# Patient Record
Sex: Female | Born: 1985
Health system: Southern US, Community
[De-identification: ages and names within clinical notes are randomized; demographics above are authoritative.]

## PROBLEM LIST (undated history)

## (undated) ENCOUNTER — Inpatient Hospital Stay (HOSPITAL_COMMUNITY): Payer: Self-pay

## (undated) DIAGNOSIS — F419 Anxiety disorder, unspecified: Secondary | ICD-10-CM

## (undated) HISTORY — PX: DILATION AND CURETTAGE OF UTERUS: SHX78

## (undated) HISTORY — PX: WISDOM TOOTH EXTRACTION: SHX21

---

## 2007-12-08 ENCOUNTER — Emergency Department (HOSPITAL_COMMUNITY): Admission: EM | Admit: 2007-12-08 | Discharge: 2007-12-08 | Payer: Self-pay | Admitting: Emergency Medicine

## 2007-12-17 ENCOUNTER — Inpatient Hospital Stay (HOSPITAL_COMMUNITY): Admission: AD | Admit: 2007-12-17 | Discharge: 2007-12-17 | Payer: Self-pay | Admitting: Obstetrics and Gynecology

## 2007-12-20 ENCOUNTER — Observation Stay (HOSPITAL_COMMUNITY): Admission: AD | Admit: 2007-12-20 | Discharge: 2007-12-21 | Payer: Self-pay | Admitting: Obstetrics and Gynecology

## 2008-01-02 ENCOUNTER — Inpatient Hospital Stay (HOSPITAL_COMMUNITY): Admission: AD | Admit: 2008-01-02 | Discharge: 2008-01-02 | Payer: Self-pay | Admitting: Obstetrics & Gynecology

## 2008-03-01 ENCOUNTER — Ambulatory Visit (HOSPITAL_COMMUNITY): Admission: RE | Admit: 2008-03-01 | Discharge: 2008-03-01 | Payer: Self-pay | Admitting: Obstetrics

## 2008-07-30 ENCOUNTER — Inpatient Hospital Stay (HOSPITAL_COMMUNITY): Admission: AD | Admit: 2008-07-30 | Discharge: 2008-08-02 | Payer: Self-pay | Admitting: Obstetrics & Gynecology

## 2008-07-30 ENCOUNTER — Inpatient Hospital Stay (HOSPITAL_COMMUNITY): Admission: AD | Admit: 2008-07-30 | Discharge: 2008-07-30 | Payer: Self-pay | Admitting: Obstetrics

## 2008-07-31 ENCOUNTER — Encounter: Payer: Self-pay | Admitting: Obstetrics & Gynecology

## 2008-12-20 ENCOUNTER — Emergency Department (HOSPITAL_COMMUNITY): Admission: EM | Admit: 2008-12-20 | Discharge: 2008-12-20 | Payer: Self-pay | Admitting: Emergency Medicine

## 2009-06-02 ENCOUNTER — Emergency Department (HOSPITAL_COMMUNITY): Admission: EM | Admit: 2009-06-02 | Discharge: 2009-06-03 | Payer: Self-pay | Admitting: Emergency Medicine

## 2009-06-04 ENCOUNTER — Emergency Department (HOSPITAL_COMMUNITY): Admission: EM | Admit: 2009-06-04 | Discharge: 2009-06-04 | Payer: Self-pay | Admitting: Emergency Medicine

## 2010-11-13 LAB — COMPREHENSIVE METABOLIC PANEL
ALT: 13 U/L (ref 0–35)
AST: 16 U/L (ref 0–37)
Alkaline Phosphatase: 72 U/L (ref 39–117)
CO2: 26 mEq/L (ref 19–32)
Chloride: 108 mEq/L (ref 96–112)
GFR calc Af Amer: >60 mL/min (ref >60)
GFR calc non Af Amer: >60 mL/min (ref >60)
Sodium: 140 mEq/L (ref 135–145)
Total Bilirubin: 1.1 mg/dL (ref 0.3–1.2)

## 2010-11-13 LAB — DIFFERENTIAL
Basophils Absolute: 0 10*3/uL (ref 0.0–0.1)
Basophils Relative: 0 % (ref 0–1)
Eosinophils Absolute: 0 10*3/uL (ref 0.0–0.7)
Eosinophils Relative: 0 % (ref 0–5)

## 2010-11-13 LAB — URINALYSIS, ROUTINE W REFLEX MICROSCOPIC
Glucose, UA: NEGATIVE mg/dL
Ketones, ur: NEGATIVE mg/dL
Protein, ur: NEGATIVE mg/dL
Urobilinogen, UA: 1 mg/dL (ref 0.0–1.0)

## 2010-11-13 LAB — CBC
Hemoglobin: 13.9 g/dL (ref 12.0–15.0)
RBC: 4.68 MIL/uL (ref 3.87–5.11)
WBC: 8.4 10*3/uL (ref 4.0–10.5)

## 2010-11-13 LAB — LIPASE, BLOOD: Lipase: 16 U/L (ref 11–59)

## 2010-11-13 LAB — URINE MICROSCOPIC-ADD ON

## 2010-12-18 NOTE — Discharge Summary (Signed)
NAME:  Teresa Riddle, Teresa Riddle             ACCOUNT NO.:  [id number]   MEDICAL RECORD NO.:  [id number]          PATIENT TYPE:  OBV   LOCATION:  9307                          [facility]  [facility]   PHYSICIAN:  Allie Bossier, MD        DATE OF BIRTH:  1986/01/24   DATE OF ADMISSION:  12/20/2007  DATE OF DISCHARGE:  12/21/2007                               DISCHARGE SUMMARY   ADMISSION DIAGNOSES:  1. Intrauterine pregnancy at 8 weeks and 3 days' gestation.  2. Hyperemesis.   DISCHARGE DIAGNOSES:  1. Intrauterine pregnancy at 8 weeks and 5 days' gestation.  2. Hyperemesis.   PROCEDURE:  The patient did have an ultrasound performed on the day of  admission showing a single intrauterine gestation at 8 weeks and 3 days'  gestation.   COMPLICATIONS:  None.   CONSULTATIONS:  None.   PERTINENT LABORATORY FINDINGS:  The patient had admission urinalysis  that showed a specific gravity of greater than 1.030, 100 of glucose,  greater than 80 ketones, and 30 protein.  Basic metabolic panel was  grossly normal except for potassium of 3.2.  LFTs were normal.  Amylase  and lipase were normal.   BRIEF PERTINENT ADMISSION HISTORY:  [name] is a 25 year old gravida  3, para 1-0-1-1 at 8 weeks and 3 days' gestation admitted for  intractable nausea and vomiting.  She has had 3 previous MAU visits, at  which time, she was discharged home on Zofran.  Due to this intractable  nausea and vomiting and her current MAU visits, we will admit for IV  hydration and medical management of her hyperemesis.   HOSPITAL COURSE:  [name] was admitted, started on IV fluids, first  with a liter of LR and then 2 liters of D5-LR to 200 mL per hour.  She  was given Phenergan per rectum and Reglan p.o. as well as Zofran and  Protonix in her IV.  Her diet was n.p.o. initially and then advanced to  full liquids for breakfast.  She tolerated this well.  Her diet was  advanced to full diet for lunch, which she tolerated well.   Physical  examination was normal.  Vitals were stable.  She was stable for  discharge on hospital day #2.   DISCHARGE STATUS:  Stable.   DISCHARGE MEDICATIONS:  1. Continue Zofran as previously prescribed.  2. Reglan 10 mg 1 tablet up to 3 times daily with meals as needed for      nausea and vomiting.  3. Phenergan 25 mg per rectum 1 tablet every 6 hours as needed for      nausea and vomiting.   DISCHARGE INSTRUCTIONS:  1. Discharge home.  2. The patient was encouraged to eat a bland diet as tolerated.  3. Activity as tolerated.  4. The patient is to [facility] for her      prenatal care.  She is to call to schedule an appointment.      Karlton Lemon, MD  Electronically Signed     ______________________________  Allie Bossier, MD    NS/MEDQ  D:  12/21/2007  T:  12/22/2007  Job:  [id number]

## 2010-12-18 NOTE — H&P (Signed)
NAME:  LOXLEY, CIBRIAN NO.:  [id number]   MEDICAL RECORD NO.:  [id number]          PATIENT TYPE:  INP   LOCATION:  9169                          [facility]   PHYSICIAN:  Roseanna Rainbow, M.D.DATE OF BIRTH:  1985/08/25   DATE OF ADMISSION:  07/30/2008  DATE OF DISCHARGE:                              HISTORY & PHYSICAL   CHIEF COMPLAINT:  The patient is a 25 year old, para 1, with an  estimated date of confinement of December 24 with an intrauterine  pregnancy at 40 plus weeks, complaining of contractions.   HISTORY OF PRESENT ILLNESS:  Please see the above.  She denies rupture  of membranes.   ALLERGIES:  No known drug allergies.   MEDICATIONS:  Prenatal vitamins.   PRENATAL LABS:  Chlamydia probe negative, urine culture and sensitivity  insignificant growth, GC probe negative, 1-hour GTT 100, hepatitis B  surface antigen negative, hematocrit 38.9, herpes culture negative,  hemoglobin 12.1, HIV nonreactive, quad screen negative, blood type is O  positive, antibody screen negative, RPR nonreactive, rubella immune,  sickle cell negative, and GBS positive.   PAST OBSTETRICAL HISTORY:  There is a history of spontaneous abortion.  She was delivered of a live born infant in May 2007, full term, weight 7  pounds 14 ounces, and vaginal delivery.   PAST GYN HISTORY:  There is history of D&C.   PAST MEDICAL HISTORY:  Tension headaches.   PAST SURGICAL HISTORY:  Please see the above.   SOCIAL HISTORY:  Curator.  She is single, living with significant  other.  Does not give any significant history of alcohol use.  She has  no significant smoking history.  Denies illicit drug use.   FAMILY HISTORY:  Noncontributory.   PHYSICAL EXAMINATION:  VITAL SIGNS:  Stable, afebrile.  Fetal heart  tracing reassuring.  Tocodynamometer contractions every 2-4 minutes with  occasional coupling, sterile vaginal exam per the RN.  Cervix is 7-8 cm  dilated.  GENERAL:   Well developed and well nourished.  ABDOMEN:  Gravid.  By Thayer Ohm, estimated fetal weight of 3400 to 3700 g.   LABORATORY DATA:  GBS positive.   ASSESSMENT AND PLAN:  Primipara at term, active labor.  Fetal heart  tracing consistent with fetal well being.  GBS positive.   PLAN:  Admission, epidural, penicillin GBS prophylaxis.  Anticipate a  vaginal delivery.      Roseanna Rainbow, M.D.  Electronically Signed     LAJ/MEDQ  D:  07/30/2008  T:  07/31/2008  Job:  [id number]

## 2011-05-01 LAB — URINALYSIS, ROUTINE W REFLEX MICROSCOPIC
Ketones, ur: >80 — AB
Nitrite: NEGATIVE
Nitrite: NEGATIVE
Protein, ur: NEGATIVE
Urobilinogen, UA: 0.2
Urobilinogen, UA: 1
pH: 5.5

## 2011-05-01 LAB — BASIC METABOLIC PANEL
CO2: 25
Calcium: 9.8
GFR calc Af Amer: >60
Sodium: 135

## 2011-05-01 LAB — URINE MICROSCOPIC-ADD ON

## 2011-05-01 LAB — H. PYLORI ANTIBODY, IGG: H Pylori IgG: <0.4

## 2011-05-01 LAB — HEPATIC FUNCTION PANEL
Albumin: 3.2 — ABNORMAL LOW
Alkaline Phosphatase: 46
Total Protein: 5.6 — ABNORMAL LOW

## 2011-05-01 LAB — LIPASE, BLOOD: Lipase: 24

## 2011-05-10 LAB — CBC
HCT: 40.3 % (ref 36.0–46.0)
Hemoglobin: 11.4 g/dL — ABNORMAL LOW (ref 12.0–15.0)
Hemoglobin: 13.7 g/dL (ref 12.0–15.0)
MCHC: 34 g/dL (ref 30.0–36.0)
MCV: 87 fL (ref 78.0–100.0)
Platelets: 163 10*3/uL (ref 150–400)
RBC: 3.85 MIL/uL — ABNORMAL LOW (ref 3.87–5.11)
RBC: 4.63 MIL/uL (ref 3.87–5.11)
RDW: 14.1 % (ref 11.5–15.5)
WBC: 11.2 10*3/uL — ABNORMAL HIGH (ref 4.0–10.5)

## 2011-05-10 LAB — RPR: RPR Ser Ql: NONREACTIVE

## 2011-07-21 ENCOUNTER — Emergency Department (HOSPITAL_COMMUNITY)
Admission: EM | Admit: 2011-07-21 | Discharge: 2011-07-21 | Disposition: A | Discharge status: Home / Self Care | Payer: Self-pay | Source: Home / Self Care | Attending: Family Medicine | Admitting: Family Medicine

## 2011-07-21 DIAGNOSIS — J069 Acute upper respiratory infection, unspecified: Secondary | ICD-10-CM

## 2011-07-21 MED ORDER — GUAIFENESIN-CODEINE 100-10 MG/5ML PO SYRP: 10.0000 mL | ORAL_SOLUTION | Freq: Four times a day (QID) | ORAL | Status: AC | PRN

## 2011-07-21 NOTE — ED Provider Notes (Signed)
History     CSN: [id number] Arrival date & time: 07/21/2011  9:18 AM   First MD Initiated Contact with Patient 07/21/11 [phone]      Chief Complaint  Patient presents with  . Cough    (Consider location/radiation/quality/duration/timing/severity/associated sxs/prior treatment) Patient is a 25 y.o. female presenting with cough. The history is provided by the patient.  Cough This is a new problem. The current episode started more than 2 days ago. The problem has not changed since onset.The cough is productive of sputum. Associated symptoms include rhinorrhea and sore throat. She has tried decongestants for the symptoms. She is not a smoker.    History reviewed. No pertinent past medical history.  Past Surgical History  Procedure Date  . Dilation and curettage of uterus     History reviewed. No pertinent family history.  History  Substance Use Topics  . Smoking status: Never Smoker   . Smokeless tobacco: Not on file  . Alcohol Use: No    OB History    Grav Para Term Preterm Abortions TAB SAB Ect Mult Living                  Review of Systems  Constitutional: Negative.   HENT: Positive for congestion, sore throat, rhinorrhea and postnasal drip.   Respiratory: Positive for cough.   Gastrointestinal: Negative.   Skin: Negative.     Allergies  Review of patient's allergies indicates no known allergies.  Home Medications   Current Outpatient Rx  Name Route Sig Dispense Refill  . OVER THE COUNTER MEDICATION       . GUAIFENESIN-CODEINE 100-10 MG/5ML PO SYRP Oral Take 10 mLs by mouth 4 (four) times daily as needed for cough. 180 mL 0    BP 114/66  Pulse 76  Temp(Src) 98.4 F (36.9 C) (Oral)  Resp 18  LMP 07/15/2011  Physical Exam  Nursing note and vitals reviewed. Constitutional: She appears well-developed and well-nourished.  HENT:  Head: Normocephalic.  Right Ear: External ear normal.  Left Ear: External ear normal.  Mouth/Throat: Oropharynx is clear and  moist.  Eyes: Pupils are equal, round, and reactive to light.  Neck: Normal range of motion. Neck supple.  Cardiovascular: Normal rate and normal heart sounds.   Pulmonary/Chest: Effort normal and breath sounds normal.  Skin: Skin is warm and dry.    ED Course  Procedures (including critical care time)  Labs Reviewed - No data to display No results found.   1. URI (upper respiratory infection)       MDM          Barkley Bruns, MD 07/21/11 [id number]

## 2011-07-21 NOTE — ED Notes (Signed)
Pt has cough, sorethroat and earache for three days.

## 2014-04-01 ENCOUNTER — Encounter (HOSPITAL_COMMUNITY): Payer: Self-pay | Admitting: Emergency Medicine

## 2014-04-01 ENCOUNTER — Emergency Department (HOSPITAL_COMMUNITY)
Admission: EM | Admit: 2014-04-01 | Discharge: 2014-04-01 | Disposition: A | Discharge status: Home / Self Care | Payer: Self-pay | Attending: Emergency Medicine | Admitting: Emergency Medicine

## 2014-04-01 DIAGNOSIS — K089 Disorder of teeth and supporting structures, unspecified: Secondary | ICD-10-CM | POA: Insufficient documentation

## 2014-04-01 DIAGNOSIS — Z792 Long term (current) use of antibiotics: Secondary | ICD-10-CM | POA: Insufficient documentation

## 2014-04-01 DIAGNOSIS — K0889 Other specified disorders of teeth and supporting structures: Secondary | ICD-10-CM

## 2014-04-01 MED ORDER — PENICILLIN V POTASSIUM 500 MG PO TABS: 500.0000 mg | ORAL_TABLET | Freq: Four times a day (QID) | ORAL | Status: AC

## 2014-04-01 MED ORDER — BUPIVACAINE-EPINEPHRINE (PF) 0.5% -1:200000 IJ SOLN
1.8000 mL | Freq: Once | INTRAMUSCULAR | Status: AC
  Administered 2014-04-01: 1.8 mL
  Filled 2014-04-01: qty 1.8

## 2014-04-01 MED ORDER — HYDROCODONE-ACETAMINOPHEN 5-325 MG PO TABS: 1.0000 | ORAL_TABLET | Freq: Four times a day (QID) | ORAL | Status: DC | PRN

## 2014-04-01 NOTE — Discharge Instructions (Signed)
Dental Pain °A tooth ache may be caused by cavities (tooth decay). Cavities expose the nerve of the tooth to air and hot or cold temperatures. It may come from an infection or abscess (also called a boil or furuncle) around your tooth. It is also often caused by dental caries (tooth decay). This causes the pain you are having. °DIAGNOSIS  °Your caregiver can diagnose this problem by exam. °TREATMENT  °· If caused by an infection, it may be treated with medications which kill germs (antibiotics) and pain medications as prescribed by your caregiver. Take medications as directed. °· Only take over-the-counter or prescription medicines for pain, discomfort, or fever as directed by your caregiver. °· Whether the tooth ache today is caused by infection or dental disease, you should see your dentist as soon as possible for further care. °SEEK MEDICAL CARE IF: °The exam and treatment you received today has been provided on an emergency basis only. This is not a substitute for complete medical or dental care. If your problem worsens or new problems (symptoms) appear, and you are unable to meet with your dentist, call or return to this location. °SEEK IMMEDIATE MEDICAL CARE IF:  °· You have a fever. °· You develop redness and swelling of your face, jaw, or neck. °· You are unable to open your mouth. °· You have severe pain uncontrolled by pain medicine. °MAKE SURE YOU:  °· Understand these instructions. °· Will watch your condition. °· Will get help right away if you are not doing well or get worse. °Document Released: 07/22/2005 Document Revised: 10/14/2011 Document Reviewed: 03/09/2008 °ExitCare® Patient Information ©2015 [employer]. This information is not intended to replace advice given to you by your health care provider. Make sure you discuss any questions you have with your health care provider. ° °Emergency Department Resource Guide °1) Find a Doctor and Pay Out of Pocket °Although you won't have to find out who  is covered by your insurance plan, it is a good idea to ask around and get recommendations. You will then need to call the office and see if the doctor you have chosen will accept you as a new patient and what types of options they offer for patients who are self-pay. Some doctors offer discounts or will set up payment plans for their patients who do not have insurance, but you will need to ask so you aren't surprised when you get to your appointment. ° °2) Contact Your Local Health Department °Not all health departments have doctors that can see patients for sick visits, but many do, so it is worth a call to see if yours does. If you don't know where your local health department is, you can check in your phone book. The CDC also has a tool to help you locate your state's health department, and many state websites also have listings of all of their local health departments. ° °3) Find a Walk-in Clinic °If your illness is not likely to be very severe or complicated, you may want to try a walk in clinic. These are popping up all over the country in pharmacies, drugstores, and shopping centers. They're usually staffed by nurse practitioners or physician assistants that have been trained to treat common illnesses and complaints. They're usually fairly quick and inexpensive. However, if you have serious medical issues or chronic medical problems, these are probably not your best option. ° °No Primary Care Doctor: °- Call [facility] at  832-8000 - they can help you locate a primary   care doctor that  accepts your insurance, provides certain services, etc. °- Physician Referral Service- 1-800-533-3463 ° °Chronic Pain Problems: °Organization         Address  Phone   Notes  °[facility]  (336) 297-2271 Patients need to be referred by their primary care doctor.  ° °Medication Assistance: °Organization         Address  Phone   Notes  °[place] [facility] [address]  [address] °[place], [place] [address] (336) 641-8030 --Must be a resident of [place] °-- Must have NO insurance coverage whatsoever (no Medicaid/ Medicare, etc.) °-- The pt. MUST have a primary care doctor that directs their care regularly and follows them in the community °  °[facility]  (866) 331-1348   °[employer]  (888) 892-1162   ° °Agencies that provide inexpensive medical care: °Organization         Address  Phone   Notes  °[facility]  (336) 832-8035   °[facility]    (336) 832-7272   °[facility] [address] °[place], [place] [address] (336) 832-4777   °[facility] [address], °[place] (336) 271-4999   °[facility]    (336) 373-0678   °[facility]    (336) 272-1050   °[facility] ° [address], [place] Phone:  (336) 832-4444, Fax:  (336) 832-4440 Hours of Operation:  9 am - 6 pm, M-F.  Also accepts Medicaid/Medicare and self-pay.  °[facility] ° [address], [place] Phone: (336) 832-3150, Fax: (336) 832-3151. Hours of Operation:  8:30 am - 5:30 pm, M-F.  Also accepts Medicaid and self-pay.  °[facility] [place] [address], [place] Phone: (336) 878-6027   °[facility] [address], [place], [place] (336)723-1848, Ext. 123 Mondays & Thursdays: 7-9 AM.  First 15 patients are seen on a first come, first serve basis. °  ° °Medicaid-accepting [place] Providers: ° °Organization         Address  Phone   Notes  °[facility] [address], [place] (336) 641-2100 Also accepts self-pay patients.  °[facility] [address], [place] ° (336) 856-9996   °[facility] [address], [place] (336) 288-8857   °[facility] [address] [place] [address], [place] (336) 299-7000   °Veita Bland [address], [place]  ° (336) 373-1557 Only accepts [place] Access Medicaid patients after they have their name applied to their card.  ° °Self-Pay (no insurance) in [place]: ° °Organization         Address  Phone   Notes  °Sickle Cell Patients, [facility] [address], [place] (336) 832-1970   °[facility] [address], [place] (336) 832-4400   °[facility] ° [address], [place] (336) 992-4800   °[facility]/Dr. Osei-Bonsu ° [address] [place] [address], [place] or [address], [place] (336) 841-8500 Phone number for both [place] and [place] locations is the same.  °[facility] [address], [place] (336) 299-0000   °[facility] [address] [place] [address], [place] or [address] (336) 852-7530 °(336) 878-2260   °  [facility] [address], [place] (336) 350-1642, phone; (336) 294-5005, fax Sees patients 1st and 3rd Saturday of every month.  Must not qualify for public or private insurance (i.e. Medicaid, Medicare, [place] Health Choice, Veterans' Benefits) • Household income should be no more than 200% of the poverty level •The clinic cannot treat you if you are pregnant or think you are pregnant • Sexually transmitted diseases are not treated at the clinic.  ° ° °Dental Care: °Organization         Address  Phone  Notes  °[facility] [address], [place] (336) 641-6152 Accepts children up to age 21 who are enrolled in Medicaid or [place] Health Choice; pregnant women with a Medicaid card; and children who have applied for Medicaid or [place] Health Choice, but were declined, whose parents can pay a reduced fee at time of service.  °[facility] [place]  [address], [place] (336) 641-7733 Accepts children up to age 21 who are  enrolled in Medicaid or [place] Health Choice; pregnant women with a Medicaid card; and children who have applied for Medicaid or [place] Health Choice, but were declined, whose parents can pay a reduced fee at time of service.  °[facility] ° [address], [place] (336) 641-4533 Patients are seen by appointment only. Walk-ins are not accepted. [facility] will see patients 18 years of age and older. °Monday - Tuesday (8am-5pm) °Most Wednesdays (8:30-5pm) °$30 per visit, cash only  °[facility] ° [address], [place] (336) 641-4533 Patients are seen by appointment only. Walk-ins are not accepted. [facility] will see patients 18 years of age and older. °One Wednesday Evening (Monthly: Volunteer Based).  $30 per visit, cash only  °[facility]  (919) 537-3737 for adults; Children under age 4, call Graduate Pediatric Dentistry at (919) 537-3956. Children aged 4-14, please call (919) 537-3737 to request a pediatric application. ° Dental services are provided in all areas of dental care including fillings, crowns and bridges, complete and partial dentures, implants, gum treatment, root canals, and extractions. Preventive care is also provided. Treatment is provided to both adults and children. °Patients are selected via a lottery and there is often a waiting list. °  °[facility] [address], °[place] ° (336) 763-8833 [email or web address] °  °[facility] [address], [place], [place] (336)723-1848, Ext. 123 Second and Fourth Thursday of each month, opens at 6:30 AM; Clinic ends at 9 AM.  Patients are seen on a first-come first-served basis, and a limited number are seen during each clinic.  ° °[facility] ° [address], [place], [place] (336) 723-7904   Eligibility Requirements °You must have lived in [place], [place], or [place] counties for at least the last three months. °  You  cannot be eligible for state or federal sponsored healthcare insurance, including Veterans Administration, Medicaid, or Medicare. °  You generally cannot be eligible for healthcare insurance through your employer.  °  How to apply: °Eligibility screenings are held every Tuesday and Wednesday afternoon from 1:00 pm until 4:00 pm. You do not need an appointment for the interview!  °[facility] [address], [place], [place] 336-631-2330   °[place] [facility]  336-342-8273   °[place] [facility]  336-703-3100   ° [facility]  336-570-6415   ° °Behavioral Health Resources in the Community: °Intensive Outpatient Programs °Organization         Address  Phone  Notes  °[facility] [address], [place], [place] 336-878-6098   °[facility] [address], [place], [place] 336-832-9800   °[facility] [address], [place], [place] ° 336-882-2125   °[facility] [address],  °[place], [place] 1-800-853-5163 or 336-641-4981   °Substance Abuse Resources °Organization         Address  Phone  Notes  °[facility]  336-882-2125   °[facility]  336-784-9470   °[facility]  336-285-9073   °[facility]  336-845-3988   °Residential & Outpatient Substance Abuse Program  1-800-659-3381   °Psychological Services °Organization         Address  Phone  Notes  °[facility]  336- 832-9600   °[facility]  336- 378-7881   °[facility] [address], [place] 1-800-853-5163 or 336-641-4981   ° °Mobile Crisis Teams °Organization         Address  Phone  Notes  °[facility], Mobile Crisis Care Unit  1-877-626-1772   °Assertive °[facility] ° [address] [place], [place] 336-834-9664   °Sharon DeEsch [address] °[place] 336-554-5454   ° °Self-Help/Support  Groups °Organization         Address  Phone             Notes  °[facility] [facility] - variety of support groups  336- 373-1402 Call for more information  °Narcotics Anonymous (NA), [facility] [address], °[place]  2 meetings at this location  ° °Residential Treatment Programs °Organization         Address  Phone  Notes  °[facility] [address],    °[place]  1-866-801-8205   °[facility] ° [address], [place], [place] 704-293-8524   °[facility] [address], [place] 336-845-3988 Admissions: 8am-3pm M-F  °[facility] [address]    °[place], [place] 336-841-1104   °[facility] [address], [place], [place] 336-379-7146   °[facility] [address]  °[place], [place] 336-285-9073   °[facility] - Intensive Outpatient [address], , [place] 336-852-3033   °[facility] [address]  °[place], [place] 1-877-615-2722 or 336-784-9470   °[facility] ([facility] [address] [place], [place] 336-227-7417 Accepts Medicaid  °[facility] [address]  ° [place] 1-800-659-3381 Substance Abuse/Addiction Treatment  ° °[place] Behavioral Health Resources °Organization         Address  Phone  Notes  °[facility]  (888) 581-9988   °Julie Brannon, PhD [address] [place], [place]   (336) 349-5553 or (336) 951-0000   °[facility]   [address] °[place], [place] (336) 349-4454   °[facility] [address], [place], [place] (336) 342-8316 Insurance/Medicaid/sponsorship through [facility]  °[facility] [address]                                    , [place] (336) 342-8316 Therapy/tele-psych/case  °[facility]   [address]  ° [place], [place] (336) 349-2233    °Dr. Arfeen  (336) 349-4544   °[facility] [place]  [facility]  [employer] [place] [facility] 1) [address], [place] °2) [address], [place] °3)  [address], [place] (336) 349-3220 °(336) 342-7768 ° °(336) 342-8140   °[place] [facility] (336) 342-1394 or (336) 342-3537 (After Hours)    ° ° ° °

## 2014-04-01 NOTE — ED Notes (Addendum)
Pt reports rt lower dental pain x2 days, pt w/ rt facial swelling - denies any fever. Pt states she started taking her sister's meloxicam and doxycycline w/o improvement of symptoms.

## 2014-04-01 NOTE — ED Notes (Signed)
Aware to take abx until completion. Knows not to drink alcohol/drive/operate heavy machinery while taking Norco. Given resource guide. No other questions/concerns.

## 2014-04-01 NOTE — ED Provider Notes (Signed)
CSN: [id number]     Arrival date & time 04/01/14  2140 History  This chart was scribed for non-physician practitioner, Teresa Breach, PA-C working with [place], DO by Teresa Riddle, ED scribe. This patient was seen in room WTR8/WTR8 and the patient's care was started at 10:31 PM.    Chief Complaint  Patient presents with  . Dental Pain   The history is provided by the patient. No language interpreter was used.   HPI Comments: Teresa Riddle is a 28 y.o. female who presents to the Emergency Department complaining of worsening dental pain that started approximately 2 days ago. He states that her symptoms started with a gradual onset of pain. She states that the tooth had a filling on it which broke off, but that was a long time ago. Ms.Teresa Riddle describes the pain as "throbbing" in nature and radiating up her right ear. Pt states that the pain is worsened by talking and chewing. She reports taking Ibuprofen 800 MG which worked but then the pain would return, her sisters doxycyline (2 pills), and meloxicam. Pt states that she does not have a dentist. Denies any drainage or medicinal allergies.  No past medical history on file. Past Surgical History  Procedure Laterality Date  . Dilation and curettage of uterus     No family history on file. History  Substance Use Topics  . Smoking status: Never Smoker   . Smokeless tobacco: Not on file  . Alcohol Use: No   OB History   Grav Para Term Preterm Abortions TAB SAB Ect Mult Living                 Review of Systems  HENT: Positive for dental problem.   All other systems reviewed and are negative.  Allergies  Review of patient's allergies indicates no known allergies.  Home Medications   Prior to Admission medications   Medication Sig Start Date End Date Taking? Authorizing Provider  ibuprofen (ADVIL,MOTRIN) 200 MG tablet Take 200 mg by mouth every 6 (six) hours as needed.   Yes Historical Provider, MD  HYDROcodone-acetaminophen  (NORCO/VICODIN) 5-325 MG per tablet Take 1-2 tablets by mouth every 6 (six) hours as needed for moderate pain or severe pain. 04/01/14   Teresa Breach, PA-C  OVER THE COUNTER MEDICATION      Historical Provider, MD  penicillin v potassium (VEETID) 500 MG tablet Take 1 tablet (500 mg total) by mouth 4 (four) times daily. 04/01/14 04/08/14  Teresa Breach, PA-C   Triage Vitals: BP 126/77  Pulse 68  Temp(Src) 98.8 F (37.1 C) (Oral)  Resp 20  Ht 5\' 4"  (1.626 m)  Wt 240 lb (108.863 kg)  BMI 41.18 kg/m2  SpO2 100%  LMP 03/21/2014  Physical Exam  Nursing note and vitals reviewed. Constitutional: She is oriented to person, place, and time. She appears well-developed and well-nourished. No distress.  Nontoxic/nonseptic appearing  HENT:  Head: Normocephalic and atraumatic.  Right Ear: Hearing, tympanic membrane, external ear and ear canal normal. No mastoid tenderness.  Left Ear: Hearing, tympanic membrane, external ear and ear canal normal. No mastoid tenderness.  Nose: Nose normal.  Mouth/Throat: Uvula is midline, oropharynx is clear and moist and mucous membranes are normal. No oral lesions. No trismus in the jaw. No dental abscesses or uvula swelling.    Tenderness to palpation of right lower first molar. No gingival swelling or fluctuance. Patient tolerating secretions without difficulty. Uvula midline.  Eyes: Conjunctivae and EOM are normal. No scleral  icterus.  Neck: Normal range of motion. Neck supple.  Pulmonary/Chest: Effort normal. No respiratory distress.  Musculoskeletal: Normal range of motion.  Neurological: She is alert and oriented to person, place, and time.  Skin: Skin is warm and dry. No rash noted. She is not diaphoretic. No erythema. No pallor.  Psychiatric: She has a normal mood and affect. Her behavior is normal.    ED Course  Dental Date/Time: 04/01/2014 10:41 PM Performed by: Teresa Riddle Authorized by: Teresa Riddle Consent: Verbal consent obtained. written consent  not obtained. The procedure was performed in an emergent situation. Risks and benefits: risks, benefits and alternatives were discussed Consent given by: patient Patient understanding: patient states understanding of the procedure being performed Patient consent: the patient's understanding of the procedure matches consent given Procedure consent: procedure consent matches procedure scheduled Relevant documents: relevant documents present and verified Test results: test results available and properly labeled Site marked: the operative site was marked Imaging studies: imaging studies available Required items: required blood products, implants, devices, and special equipment available Patient identity confirmed: verbally with patient and arm band Time out: Immediately prior to procedure a "time out" was called to verify the correct patient, procedure, equipment, support staff and site/side marked as required. Preparation: Patient was prepped and draped in the usual sterile fashion. Local anesthesia used: yes Anesthesia: local infiltration Local anesthetic: bupivacaine 0.5% with epinephrine Anesthetic total: 1.8 ml Patient sedated: no Patient tolerance: Patient tolerated the procedure well with no immediate complications.   (including critical care time)  DIAGNOSTIC STUDIES: Oxygen Saturation is 100% on RA, normal by my interpretation.    COORDINATION OF CARE: 10:37 PM- Pt advised of plan for treatment and pt agrees.  Labs Review Labs Reviewed - No data to display  Imaging Review No results found.   EKG Interpretation None      MDM   Final diagnoses:  Dentalgia    Patient with toothache. No gross abscess. Exam unconcerning for Ludwig's angina or spread of infection. Tx in ED with dental block. Will treat with penicillin and pain medicine. Urged patient to follow-up with dentist. Referral and resource guide provided. Return precautions also provided and patient agreeable to  plan with no unaddressed concerns.  I personally performed the services described in this documentation, which was scribed in my presence. The recorded information has been reviewed and is accurate.   Filed Vitals:   04/01/14 2158  BP: 126/77  Pulse: 68  Temp: 98.8 F (37.1 C)  TempSrc: Oral  Resp: 20  Height: 5\' 4"  (1.626 m)  Weight: 240 lb (108.863 kg)  SpO2: 100%      Teresa Breach, PA-C 04/01/14 2241

## 2014-04-01 NOTE — ED Provider Notes (Signed)
Medical screening examination/treatment/procedure(s) were performed by non-physician practitioner and as supervising physician I was immediately available for consultation/collaboration.   EKG Interpretation None        [place], DO 04/01/14 2342

## 2014-05-30 ENCOUNTER — Emergency Department (HOSPITAL_COMMUNITY)
Admission: EM | Admit: 2014-05-30 | Discharge: 2014-05-30 | Disposition: A | Discharge status: Home / Self Care | Payer: Self-pay | Attending: Emergency Medicine | Admitting: Emergency Medicine

## 2014-05-30 ENCOUNTER — Encounter (HOSPITAL_COMMUNITY): Payer: Self-pay | Admitting: Emergency Medicine

## 2014-05-30 ENCOUNTER — Emergency Department (HOSPITAL_COMMUNITY): Payer: Self-pay

## 2014-05-30 DIAGNOSIS — R1012 Left upper quadrant pain: Secondary | ICD-10-CM | POA: Insufficient documentation

## 2014-05-30 DIAGNOSIS — R079 Chest pain, unspecified: Secondary | ICD-10-CM

## 2014-05-30 DIAGNOSIS — R0789 Other chest pain: Secondary | ICD-10-CM | POA: Insufficient documentation

## 2014-05-30 LAB — I-STAT TROPONIN, ED: TROPONIN I, POC: 0 ng/mL (ref 0.00–0.08)

## 2014-05-30 LAB — BASIC METABOLIC PANEL
Anion gap: 10 (ref 5–15)
BUN: 10 mg/dL (ref 6–23)
CO2: 27 mEq/L (ref 19–32)
Calcium: 9.4 mg/dL (ref 8.4–10.5)
Chloride: 104 mEq/L (ref 96–112)
Creatinine, Ser: 0.94 mg/dL (ref 0.50–1.10)
GFR calc Af Amer: >90 mL/min (ref >90)
GFR, EST NON AFRICAN AMERICAN: 82 mL/min — AB (ref >90)
Glucose, Bld: 102 mg/dL — ABNORMAL HIGH (ref 70–99)
POTASSIUM: 4.1 meq/L (ref 3.7–5.3)
SODIUM: 141 meq/L (ref 137–147)

## 2014-05-30 LAB — HEPATIC FUNCTION PANEL
ALT: 10 U/L (ref 0–35)
AST: 15 U/L (ref 0–37)
Albumin: 4 g/dL (ref 3.5–5.2)
Alkaline Phosphatase: 79 U/L (ref 39–117)
Total Bilirubin: 0.2 mg/dL — ABNORMAL LOW (ref 0.3–1.2)
Total Protein: 7.4 g/dL (ref 6.0–8.3)

## 2014-05-30 LAB — CBC
HCT: 38 % (ref 36.0–46.0)
Hemoglobin: 12.4 g/dL (ref 12.0–15.0)
MCH: 28.7 pg (ref 26.0–34.0)
MCHC: 32.6 g/dL (ref 30.0–36.0)
MCV: 88 fL (ref 78.0–100.0)
PLATELETS: 396 10*3/uL (ref 150–400)
RBC: 4.32 MIL/uL (ref 3.87–5.11)
RDW: 13 % (ref 11.5–15.5)
WBC: 5 10*3/uL (ref 4.0–10.5)

## 2014-05-30 LAB — LIPASE, BLOOD: Lipase: 25 U/L (ref 11–59)

## 2014-05-30 LAB — D-DIMER, QUANTITATIVE: D-Dimer, Quant: 0.31 ug/mL-FEU (ref 0.00–0.48)

## 2014-05-30 MED ORDER — KETOROLAC TROMETHAMINE 60 MG/2ML IM SOLN
60.0000 mg | Freq: Once | INTRAMUSCULAR | Status: AC
  Administered 2014-05-30: 60 mg via INTRAMUSCULAR
  Filled 2014-05-30: qty 2

## 2014-05-30 MED ORDER — NAPROXEN 500 MG PO TABS: 500.0000 mg | ORAL_TABLET | Freq: Two times a day (BID) | ORAL | Status: DC

## 2014-05-30 MED ORDER — OXYCODONE-ACETAMINOPHEN 5-325 MG PO TABS
1.0000 | ORAL_TABLET | Freq: Once | ORAL | Status: AC
  Administered 2014-05-30: 1 via ORAL
  Filled 2014-05-30: qty 1

## 2014-05-30 MED ORDER — HYDROCODONE-ACETAMINOPHEN 5-325 MG PO TABS: 1.0000 | ORAL_TABLET | Freq: Four times a day (QID) | ORAL | Status: DC | PRN

## 2014-05-30 NOTE — ED Notes (Signed)
Denies nausea  

## 2014-05-30 NOTE — ED Provider Notes (Signed)
CSN: [id number]     Arrival date & time 05/30/14  1557 History   First MD Initiated Contact with Patient 05/30/14 1739     Chief Complaint  Patient presents with  . Chest Pain     (Consider location/radiation/quality/duration/timing/severity/associated sxs/prior Treatment) HPI Teresa Riddle is a 28 y.o. female but no medical problems, presents to emergency department complaining of chest pain. Patient states she noticed pain this morning. She woke up pain-free and states she developed it after breakfast. Reports pain is in the center radiating to the left chest under the breast and around. Pain is worsened with movement, moving left arm, taking deep breaths. She states pain is sharp. She reports associated shortness of breath, but denies shortness of breath when there is no pain. Pain does come and go. She denies any fever, chills. No cough. No upper respiratory symptoms. No swelling in extremities. No recent travel or surgeries. She has an IUD as her birth control method. No history of blood clots. Denies any injuries. Does not remember pushing, pulling, lifting anything heavy. No history of similar pain in the past. She took Gas-X, thinking she may have gas but it did not help her.  History reviewed. No pertinent past medical history. Past Surgical History  Procedure Laterality Date  . Dilation and curettage of uterus     No family history on file. History  Substance Use Topics  . Smoking status: Never Smoker   . Smokeless tobacco: Not on file  . Alcohol Use: No   OB History   Grav Para Term Preterm Abortions TAB SAB Ect Mult Living                 Review of Systems  Constitutional: Negative for fever and chills.  Respiratory: Negative for cough, chest tightness and shortness of breath.   Cardiovascular: Positive for chest pain. Negative for palpitations and leg swelling.  Gastrointestinal: Negative for nausea, vomiting, abdominal pain and diarrhea.  Genitourinary: Negative  for dysuria and flank pain.  Musculoskeletal: Negative for arthralgias, myalgias, neck pain and neck stiffness.  Skin: Negative for rash.  Neurological: Negative for dizziness, weakness and headaches.  All other systems reviewed and are negative.     Allergies  Review of patient's allergies indicates no known allergies.  Home Medications   Prior to Admission medications   Medication Sig Start Date End Date Taking? Authorizing Provider  HYDROcodone-acetaminophen (NORCO/VICODIN) 5-325 MG per tablet Take 1-2 tablets by mouth every 6 (six) hours as needed for moderate pain or severe pain. 04/01/14   Antonietta Breach, PA-C  ibuprofen (ADVIL,MOTRIN) 200 MG tablet Take 200 mg by mouth every 6 (six) hours as needed.    Historical Provider, MD  [place]      Historical Provider, MD   BP 134/85  Pulse 112  Temp(Src) 98.3 F (36.8 C) (Oral)  Resp 18  SpO2 100%  LMP 05/23/2014 Physical Exam  Nursing note and vitals reviewed. Constitutional: She appears well-developed and well-nourished. No distress.  HENT:  Head: Normocephalic.  Eyes: Conjunctivae are normal.  Neck: Neck supple.  Cardiovascular: Normal rate, regular rhythm and normal heart sounds.   Pulmonary/Chest: Effort normal and breath sounds normal. No respiratory distress. She has no wheezes. She has no rales. She exhibits tenderness.  Tenderness over anterior and lateral left lower ribs, under the breast and in midaxillary line. No bruising, swelling, crepitus. No rash  Abdominal: Soft. Bowel sounds are normal. She exhibits no distension. There is no tenderness.  There is no rebound.  Mild tenderness in LUQ  Musculoskeletal: She exhibits no edema.  Pain in left chest with ROM of left shoulder. Pain with torso ROM, twisting.   Neurological: She is alert.  Skin: Skin is warm and dry.  Psychiatric: She has a normal mood and affect. Her behavior is normal.    ED Course  Procedures (including critical care  time) Labs Review Labs Reviewed  BASIC METABOLIC PANEL - Abnormal; Notable for the following:    Glucose, Bld 102 (*)    GFR calc non Af Amer 82 (*)    All other components within normal limits  CBC  D-DIMER, QUANTITATIVE  HEPATIC FUNCTION PANEL  LIPASE, BLOOD  I-STAT TROPOININ, ED    Imaging Review Dg Chest 2 View  05/30/2014   CLINICAL DATA:  28 year old female with recent history of central stabbing chest pain during deep breathing or movement. No associated shortness of breath.  EXAM: CHEST  2 VIEW  COMPARISON:  No priors.  FINDINGS: Lung volumes are normal. No consolidative airspace disease. No pleural effusions. No pneumothorax. No pulmonary nodule or mass noted. Pulmonary vasculature and the cardiomediastinal silhouette are within normal limits.  IMPRESSION: No radiographic evidence of acute cardiopulmonary disease.   Electronically Signed   By: Vinnie Langton M.D.   On: 05/30/2014 18:54     EKG Interpretation   Date/Time:  Monday May 30 2014 16:07:19 EDT Ventricular Rate:  108 PR Interval:  189 QRS Duration: 83 QT Interval:  335 QTC Calculation: 449 R Axis:   55 Text Interpretation:  Sinus tachycardia Abnormal Q suggests anterior  infarct Nonspecific T abnormalities, anterior leads No previous tracing  Confirmed by POLLINA  MD, CHRISTOPHER [phone]) on 05/30/2014 4:13:03 PM      MDM   Final diagnoses:  Chest pain    Patient with left-sided chest pain, reproducible palpation and movement. She is otherwise healthy 28 year old with no medical problems. No risk factors for PE or coronary artery disease. Given low risk, but tachycardic here, will get d-dimer to rule out PE. Labs and chest x-ray ordered. EKG unremarkable.  7:29 PM Chest x-ray negative. Labs all unremarkable including d-dimer, troponin. Patient's pain is definitely reproducible with movement and palpation. I suspect this is musculoskeletal pain. Discussed with patient treatment planned and return  precautions. Will treat with NSAIDs, Norco for severe pain, follow-up with primary care doctor.  Filed Vitals:   05/30/14 1605 05/30/14 1813  BP: 134/85 136/76  Pulse: 112 72  Temp: 98.3 F (36.8 C)   TempSrc: Oral   Resp: 18 20  SpO2: 100% 98%       [name] A Novella Abraha, PA-C 05/31/14 0023

## 2014-05-30 NOTE — ED Notes (Signed)
Pt states that she began noticing central, stabbing CP when she takes a deep breath or moves.  Denies SOB.  Denies NVD.

## 2014-05-30 NOTE — Discharge Instructions (Signed)
Take Naprosyn for pain. Norco for severe pain. Rest. Try heating pads. Stretches. Follow-up with primary care doctor for recheck. Return of her symptoms are worsening   Chest Wall Pain Chest wall pain is pain in or around the bones and muscles of your chest. It may take up to 6 weeks to get better. It may take longer if you must stay physically active in your work and activities.  CAUSES  Chest wall pain may happen on its own. However, it may be caused by:  A viral illness like the flu.  Injury.  Coughing.  Exercise.  Arthritis.  Fibromyalgia.  Shingles. HOME CARE INSTRUCTIONS   Avoid overtiring physical activity. Try not to strain or perform activities that cause pain. This includes any activities using your chest or your abdominal and side muscles, especially if heavy weights are used.  Put ice on the sore area.  Put ice in a plastic bag.  Place a towel between your skin and the bag.  Leave the ice on for 15-20 minutes per hour while awake for the first 2 days.  Only take over-the-counter or prescription medicines for pain, discomfort, or fever as directed by your caregiver. SEEK IMMEDIATE MEDICAL CARE IF:   Your pain increases, or you are very uncomfortable.  You have a fever.  Your chest pain becomes worse.  You have new, unexplained symptoms.  You have nausea or vomiting.  You feel sweaty or lightheaded.  You have a cough with phlegm (sputum), or you cough up blood. MAKE SURE YOU:   Understand these instructions.  Will watch your condition.  Will get help right away if you are not doing well or get worse. Document Released: 07/22/2005 Document Revised: 10/14/2011 Document Reviewed: 03/18/2011 [facility] Patient Information 2015 [place], [place]. This information is not intended to replace advice given to you by your health care provider. Make sure you discuss any questions you have with your health care provider.

## 2014-05-31 NOTE — ED Provider Notes (Signed)
Medical screening examination/treatment/procedure(s) were performed by non-physician practitioner and as supervising physician I was immediately available for consultation/collaboration.   EKG Interpretation   Date/Time:  Monday May 30 2014 16:07:19 EDT Ventricular Rate:  108 PR Interval:  189 QRS Duration: 83 QT Interval:  335 QTC Calculation: 449 R Axis:   55 Text Interpretation:  Sinus tachycardia Abnormal Q suggests anterior  infarct Nonspecific T abnormalities, anterior leads No previous tracing  Confirmed by Betsey Holiday  MD, CHRISTOPHER [phone]) on 05/30/2014 4:13:03 PM        Orpah Greek, MD 05/31/14 0023

## 2015-12-05 ENCOUNTER — Emergency Department (HOSPITAL_COMMUNITY): Payer: Self-pay

## 2015-12-05 ENCOUNTER — Encounter (HOSPITAL_COMMUNITY): Payer: Self-pay | Admitting: Emergency Medicine

## 2015-12-05 ENCOUNTER — Emergency Department (HOSPITAL_COMMUNITY)
Admission: EM | Admit: 2015-12-05 | Discharge: 2015-12-05 | Disposition: A | Discharge status: Home / Self Care | Payer: Self-pay | Attending: Emergency Medicine | Admitting: Emergency Medicine

## 2015-12-05 DIAGNOSIS — Z791 Long term (current) use of non-steroidal anti-inflammatories (NSAID): Secondary | ICD-10-CM | POA: Insufficient documentation

## 2015-12-05 DIAGNOSIS — R55 Syncope and collapse: Secondary | ICD-10-CM | POA: Insufficient documentation

## 2015-12-05 DIAGNOSIS — F419 Anxiety disorder, unspecified: Secondary | ICD-10-CM | POA: Insufficient documentation

## 2015-12-05 DIAGNOSIS — R079 Chest pain, unspecified: Secondary | ICD-10-CM

## 2015-12-05 DIAGNOSIS — R0789 Other chest pain: Secondary | ICD-10-CM | POA: Insufficient documentation

## 2015-12-05 LAB — CBC WITH DIFFERENTIAL/PLATELET
BASOS PCT: 0 %
Basophils Absolute: 0 10*3/uL (ref 0.0–0.1)
EOS ABS: 0 10*3/uL (ref 0.0–0.7)
Eosinophils Relative: 1 %
HEMATOCRIT: 38 % (ref 36.0–46.0)
HEMOGLOBIN: 12.5 g/dL (ref 12.0–15.0)
Lymphocytes Relative: 25 %
Lymphs Abs: 1.2 10*3/uL (ref 0.7–4.0)
MCH: 29 pg (ref 26.0–34.0)
MCHC: 32.9 g/dL (ref 30.0–36.0)
MCV: 88.2 fL (ref 78.0–100.0)
Monocytes Absolute: 0.3 10*3/uL (ref 0.1–1.0)
Monocytes Relative: 6 %
NEUTROS ABS: 3.4 10*3/uL (ref 1.7–7.7)
NEUTROS PCT: 68 %
Platelets: 295 10*3/uL (ref 150–400)
RBC: 4.31 MIL/uL (ref 3.87–5.11)
RDW: 13.5 % (ref 11.5–15.5)
WBC: 4.9 10*3/uL (ref 4.0–10.5)

## 2015-12-05 LAB — BASIC METABOLIC PANEL
ANION GAP: 10 (ref 5–15)
BUN: 14 mg/dL (ref 6–20)
CHLORIDE: 107 mmol/L (ref 101–111)
CO2: 23 mmol/L (ref 22–32)
CREATININE: 0.73 mg/dL (ref 0.44–1.00)
Calcium: 9.1 mg/dL (ref 8.9–10.3)
GFR calc non Af Amer: >60 mL/min (ref >60)
Glucose, Bld: 105 mg/dL — ABNORMAL HIGH (ref 65–99)
POTASSIUM: 3.7 mmol/L (ref 3.5–5.1)
SODIUM: 140 mmol/L (ref 135–145)

## 2015-12-05 LAB — I-STAT TROPONIN, ED: TROPONIN I, POC: 0 ng/mL (ref 0.00–0.08)

## 2015-12-05 MED ORDER — LORAZEPAM 1 MG PO TABS
1.0000 mg | ORAL_TABLET | Freq: Once | ORAL | Status: AC
  Administered 2015-12-05: 1 mg via ORAL
  Filled 2015-12-05: qty 1

## 2015-12-05 NOTE — ED Notes (Signed)
ED MD stated it was appropriate to only place order for ED EKG until evaluated by physicians

## 2015-12-05 NOTE — ED Notes (Signed)
Bed: WA01 Expected date:  Expected time:  Means of arrival:  Comments: TR 1

## 2015-12-05 NOTE — ED Provider Notes (Signed)
Medical screening examination/treatment/procedure(s) were conducted as a shared visit with non-physician practitioner(s) and myself.  I personally evaluated the patient during the encounter.   EKG Interpretation   Date/Time:  Tuesday Dec 05 2015 11:25:34 EDT Ventricular Rate:  98 PR Interval:  163 QRS Duration: 85 QT Interval:  337 QTC Calculation: 430 R Axis:   29 Text Interpretation:  Sinus rhythm Nonspecific T abnormalities, anterior  leads No significant change since last tracing Confirmed by Wasyl Dornfeld  MD,  Braxden Lovering ([id number]) on 12/05/2015 2:17:12 PM     Patient here after developing anxiety and chest discomfort after receiving some bad news. Her EKG and blood work reassuring. Suspect this is anxiety. Stable for discharge with return precautions  Lacretia Leigh, MD 12/05/15 1422

## 2015-12-05 NOTE — ED Notes (Signed)
Pt states her mother called her with some bad news this morning. Shortly after began feeling faint and like her heart was beating quickly. States she's never felt like this before. Is also c/o SOB and chest pain.

## 2015-12-05 NOTE — Discharge Instructions (Signed)
Chest Pain Observation °It is often hard to give a specific diagnosis for the cause of chest pain. Among other possibilities your symptoms might be caused by inadequate oxygen delivery to your heart (angina). Angina that is not treated or evaluated can lead to a heart attack (myocardial infarction) or death. °Blood tests, electrocardiograms, and X-rays may have been done to help determine a possible cause of your chest pain. After evaluation and observation, your health care provider has determined that it is unlikely your pain was caused by an unstable condition that requires hospitalization. However, a full evaluation of your pain may need to be completed, with additional diagnostic testing as directed. It is very important to keep your follow-up appointments. Not keeping your follow-up appointments could result in permanent heart damage, disability, or death. If there is any problem keeping your follow-up appointments, you must call your health care provider. °HOME CARE INSTRUCTIONS  °Due to the slight chance that your pain could be angina, it is important to follow your health care provider's treatment plan and also maintain a healthy lifestyle: °· Maintain or work toward achieving a healthy weight. °· Stay physically active and exercise regularly. °· Decrease your salt intake. °· Eat a balanced, healthy diet. Talk to a dietitian to learn about heart-healthy foods. °· Increase your fiber intake by including whole grains, vegetables, fruits, and nuts in your diet. °· Avoid situations that cause stress, anger, or depression. °· Take medicines as advised by your health care provider. Report any side effects to your health care provider. Do not stop medicines or adjust the dosages on your own. °· Quit smoking. Do not use nicotine patches or gum until you check with your health care provider. °· Keep your blood pressure, blood sugar, and cholesterol levels within normal limits. °· Limit alcohol intake to no more than  1 drink per day for women who are not pregnant and 2 drinks per day for men. °· Do not abuse drugs. °SEEK IMMEDIATE MEDICAL CARE IF: °You have severe chest pain or pressure which may include symptoms such as: °· You feel pain or pressure in your arms, neck, jaw, or back. °· You have severe back or abdominal pain, feel sick to your stomach (nauseous), or throw up (vomit). °· You are sweating profusely. °· You are having a fast or irregular heartbeat. °· You feel short of breath while at rest. °· You notice increasing shortness of breath during rest, sleep, or with activity. °· You have chest pain that does not get better after rest or after taking your usual medicine. °· You wake from sleep with chest pain. °· You are unable to sleep because you cannot breathe. °· You develop a frequent cough or you are coughing up blood. °· You feel dizzy, faint, or experience extreme fatigue. °· You develop severe weakness, dizziness, fainting, or chills. °Any of these symptoms may represent a serious problem that is an emergency. Do not wait to see if the symptoms will go away. Call your local emergency services (911 in the [place] Do not drive yourself to the hospital. °MAKE SURE YOU: °· Understand these instructions. °· Will watch your condition. °· Will get help right away if you are not doing well or get worse. °  °This information is not intended to replace advice given to you by your health care provider. Make sure you discuss any questions you have with your health care provider. °  °Document Released: 08/24/2010 Document Revised: 07/27/2013 Document Reviewed: 01/21/2013 °Elsevier Interactive Patient   Education 2016 [employer]  Nonspecific Chest Pain  Chest pain can be caused by many different conditions. There is always a chance that your pain could be related to something serious, such as a heart attack or a blood clot in your lungs. Chest pain can also be caused by conditions that are not life-threatening. If you  have chest pain, it is very important to follow up with your health care provider. CAUSES  Chest pain can be caused by:  Heartburn.  Pneumonia or bronchitis.  Anxiety or stress.  Inflammation around your heart (pericarditis) or lung (pleuritis or pleurisy).  A blood clot in your lung.  A collapsed lung (pneumothorax). It can develop suddenly on its own (spontaneous pneumothorax) or from trauma to the chest.  Shingles infection (varicella-zoster virus).  Heart attack.  Damage to the bones, muscles, and cartilage that make up your chest wall. This can include:  Bruised bones due to injury.  Strained muscles or cartilage due to frequent or repeated coughing or overwork.  Fracture to one or more ribs.  Sore cartilage due to inflammation (costochondritis). RISK FACTORS  Risk factors for chest pain may include:  Activities that increase your risk for trauma or injury to your chest.  Respiratory infections or conditions that cause frequent coughing.  Medical conditions or overeating that can cause heartburn.  Heart disease or family history of heart disease.  Conditions or health behaviors that increase your risk of developing a blood clot.  Having had chicken pox (varicella zoster). SIGNS AND SYMPTOMS Chest pain can feel like:  Burning or tingling on the surface of your chest or deep in your chest.  Crushing, pressure, aching, or squeezing pain.  Dull or sharp pain that is worse when you move, cough, or take a deep breath.  Pain that is also felt in your back, neck, shoulder, or arm, or pain that spreads to any of these areas. Your chest pain may come and go, or it may stay constant. DIAGNOSIS Lab tests or other studies may be needed to find the cause of your pain. Your health care provider may have you take a test called an ambulatory ECG (electrocardiogram). An ECG records your heartbeat patterns at the time the test is performed. You may also have other tests, such  as:  Transthoracic echocardiogram (TTE). During echocardiography, sound waves are used to create a picture of all of the heart structures and to look at how blood flows through your heart.  Transesophageal echocardiogram (TEE).This is a more advanced imaging test that obtains images from inside your body. It allows your health care provider to see your heart in finer detail.  Cardiac monitoring. This allows your health care provider to monitor your heart rate and rhythm in real time.  Holter monitor. This is a portable device that records your heartbeat and can help to diagnose abnormal heartbeats. It allows your health care provider to track your heart activity for several days, if needed.  Stress tests. These can be done through exercise or by taking medicine that makes your heart beat more quickly.  Blood tests.  Imaging tests. TREATMENT  Your treatment depends on what is causing your chest pain. Treatment may include:  Medicines. These may include:  Acid blockers for heartburn.  Anti-inflammatory medicine.  Pain medicine for inflammatory conditions.  Antibiotic medicine, if an infection is present.  Medicines to dissolve blood clots.  Medicines to treat coronary artery disease.  Supportive care for conditions that do not require medicines. This may  include:  Resting.  Applying heat or cold packs to injured areas.  Limiting activities until pain decreases. HOME CARE INSTRUCTIONS  If you were prescribed an antibiotic medicine, finish it all even if you start to feel better.  Avoid any activities that bring on chest pain.  Do not use any tobacco products, including cigarettes, chewing tobacco, or electronic cigarettes. If you need help quitting, ask your health care provider.  Do not drink alcohol.  Take medicines only as directed by your health care provider.  Keep all follow-up visits as directed by your health care provider. This is important. This includes any  further testing if your chest pain does not go away.  If heartburn is the cause for your chest pain, you may be told to keep your head raised (elevated) while sleeping. This reduces the chance that acid will go from your stomach into your esophagus.  Make lifestyle changes as directed by your health care provider. These may include:  Getting regular exercise. Ask your health care provider to suggest some activities that are safe for you.  Eating a heart-healthy diet. A registered dietitian can help you to learn healthy eating options.  Maintaining a healthy weight.  Managing diabetes, if necessary.  Reducing stress. SEEK MEDICAL CARE IF:  Your chest pain does not go away after treatment.  You have a rash with blisters on your chest.  You have a fever. SEEK IMMEDIATE MEDICAL CARE IF:   Your chest pain is worse.  You have an increasing cough, or you cough up blood.  You have severe abdominal pain.  You have severe weakness.  You faint.  You have chills.  You have sudden, unexplained chest discomfort.  You have sudden, unexplained discomfort in your arms, back, neck, or jaw.  You have shortness of breath at any time.  You suddenly start to sweat, or your skin gets clammy.  You feel nauseous or you vomit.  You suddenly feel light-headed or dizzy.  Your heart begins to beat quickly, or it feels like it is skipping beats. These symptoms may represent a serious problem that is an emergency. Do not wait to see if the symptoms will go away. Get medical help right away. Call your local emergency services (911 in the [place] Do not drive yourself to the hospital.   This information is not intended to replace advice given to you by your health care provider. Make sure you discuss any questions you have with your health care provider.   Document Released: 05/01/2005 Document Revised: 08/12/2014 Document Reviewed: 02/25/2014 Elsevier Interactive Patient Education [employer].

## 2015-12-05 NOTE — ED Provider Notes (Signed)
CSN: [id number]     Arrival date & time 12/05/15  1110 History   First MD Initiated Contact with Patient 12/05/15 1147     Chief Complaint  Patient presents with  . Near Syncope  . Anxiety  . Chest Pain   HPI  Teresa Riddle is a 30 year old female presenting with lightheadedness, palpitations and chest pain. She reports that her mother called her with some upsetting news this morning. She states that shortly afterwards she became anxious and lightheaded. She states that she felt like she was about to pass out so she sat down in a chair at work. This syncopal episode. She then reports onset of central chest pressure and feeling like her heart was racing. She states that the symptoms have been intermittent since the morning without specific exacerbating factor. She is not currently experiencing the chest pressure. She denies shortness of breath but states that she feels like she is breathing faster when her heart races. She is not currently experiencing heart racing sensation or shortness of breath. She admits to feeling extremely anxious over the knee as she received. She reports a history of anxiety and states that she has had chest pressure with anxiety in the past. She states that she has never felt like she has not passed out with her anxiety which is what scared her the most. She is not currently lightheaded but states she is scared to stand up for fear that it will return. She has no other complaints today. She denies personal or family history of cardiac disease or sudden cardiac death. Denies fevers, chills, URI symptoms, cough, abdominal pain, nausea or vomiting.  History reviewed. No pertinent past medical history. Past Surgical History  Procedure Laterality Date  . Dilation and curettage of uterus     History reviewed. No pertinent family history. Social History  Substance Use Topics  . Smoking status: Never Smoker   . Smokeless tobacco: None  . Alcohol Use: No   OB History    No data  available     Review of Systems  All other systems reviewed and are negative.     Allergies  Review of patient's allergies indicates no known allergies.  Home Medications   Prior to Admission medications   Medication Sig Start Date End Date Taking? Authorizing Provider  ibuprofen (ADVIL,MOTRIN) 200 MG tablet Take 400 mg by mouth every 6 (six) hours as needed for fever, headache, mild pain, moderate pain or cramping.    Yes Historical Provider, MD   BP 114/64 mmHg  Pulse 76  Temp(Src) 97.8 F (36.6 C) (Oral)  Resp 18  Ht 5\' 4"  (1.626 m)  Wt 117.935 kg  BMI 44.61 kg/m2  SpO2 100%  LMP 12/02/2015 Physical Exam  Constitutional: She appears well-developed and well-nourished. No distress.  HENT:  Head: Normocephalic and atraumatic.  Mouth/Throat: Oropharynx is clear and moist.  Eyes: Conjunctivae and EOM are normal. Right eye exhibits no discharge. Left eye exhibits no discharge. No scleral icterus.  Neck: Normal range of motion. Neck supple.  Cardiovascular: Normal rate, regular rhythm and normal heart sounds.   Pulmonary/Chest: Effort normal and breath sounds normal. No respiratory distress.  Musculoskeletal: Normal range of motion.  Lymphadenopathy:    She has no cervical adenopathy.  Neurological: She is alert. Coordination normal.  Skin: Skin is warm and dry.  Psychiatric: Her speech is normal and behavior is normal. Judgment normal. Her mood appears anxious.  Anxious and tearful  Nursing note and vitals reviewed.  ED Course  Procedures (including critical care time) Labs Review Labs Reviewed  BASIC METABOLIC PANEL - Abnormal; Notable for the following:    Glucose, Bld 105 (*)    All other components within normal limits  CBC WITH DIFFERENTIAL/PLATELET  Randolm Idol, ED    Imaging Review Dg Chest 2 View  12/05/2015  CLINICAL DATA:  30 year old female with acute onset tachycardia, palpitations, presyncope, shortness of breath and chest pain after  receiving bad news. Initial encounter. EXAM: CHEST  2 VIEW COMPARISON:  05/30/2014. FINDINGS: Large body habitus. Lung volumes are stable and normal. Cardiac size is stable at the upper limits of normal. Other mediastinal contours are within normal limits. Visualized tracheal air column is within normal limits. The lungs are clear. No pneumothorax or pleural effusion. No acute osseous abnormality identified. IMPRESSION: No acute cardiopulmonary abnormality. Electronically Signed   By: Genevie Ann M.D.   On: 12/05/2015 12:53   I have personally reviewed and evaluated these images and lab results as part of my medical decision-making.   EKG Interpretation   Date/Time:  Tuesday Dec 05 2015 11:25:34 EDT Ventricular Rate:  98 PR Interval:  163 QRS Duration: 85 QT Interval:  337 QTC Calculation: 430 R Axis:   29 Text Interpretation:  Sinus rhythm Nonspecific T abnormalities, anterior  leads No significant change since last tracing Confirmed by ALLEN  MD,  ANTHONY ([id number]) on 12/05/2015 2:17:12 PM      MDM   Final diagnoses:  Chest pain, unspecified chest pain type  Anxiety   30 year old female presenting with chest heaviness, lightheadedness, near syncope, palpitations and anxiety. Patient reports receiving bad news prior to onset of symptoms. Symptoms have been intermittent since. Patient reports history of anxiety with manifestations of chest pain. Afebrile. Initially tachycardic which resolved to 70s after being roomed. Hypoxia. Patient appears anxious and is tearful. Heart regular rate and rhythm. Lungs clear to auscultation bilaterally. Benign physical exam. Unremarkable blood work. Negative troponin with nonischemic EKG. Chest x-ray negative. PERC negative. Given Ativan which improved patient's symptoms. Presentation consistent with patient's past anxiety. His upper posterior for outpatient follow-up with her PCP. I have discussed reasons to return immediately to the emergency department. Patient  is stable for discharge.    Lahoma Crocker Lyndzie Zentz, PA-C 12/05/15 1423  Lacretia Leigh, MD 12/08/15 1400

## 2015-12-07 ENCOUNTER — Ambulatory Visit: Payer: Self-pay | Attending: Physician Assistant | Admitting: Physician Assistant

## 2015-12-07 ENCOUNTER — Encounter: Payer: Self-pay | Admitting: Physician Assistant

## 2015-12-07 VITALS — BP 118/79 | HR 76 | Temp 97.8°F | Wt 246.6 lb

## 2015-12-07 DIAGNOSIS — F4322 Adjustment disorder with anxiety: Secondary | ICD-10-CM

## 2015-12-07 MED ORDER — ALPRAZOLAM 0.25 MG PO TABS: 0.2500 mg | ORAL_TABLET | Freq: Three times a day (TID) | ORAL | Status: DC | PRN

## 2015-12-07 NOTE — Progress Notes (Signed)
Spent 45 mins face to face with patient.  >50% on counseling, lifestyle recommendations, and stress management techniques.

## 2015-12-07 NOTE — Patient Instructions (Signed)
Generalized Anxiety Disorder Generalized anxiety disorder (GAD) is a mental disorder. It interferes with life functions, including relationships, work, and school. GAD is different from normal anxiety, which everyone experiences at some point in their lives in response to specific life events and activities. Normal anxiety actually helps us prepare for and get through these life events and activities. Normal anxiety goes away after the event or activity is over.  GAD causes anxiety that is not necessarily related to specific events or activities. It also causes excess anxiety in proportion to specific events or activities. The anxiety associated with GAD is also difficult to control. GAD can vary from mild to severe. People with severe GAD can have intense waves of anxiety with physical symptoms (panic attacks).  SYMPTOMS The anxiety and worry associated with GAD are difficult to control. This anxiety and worry are related to many life events and activities and also occur more days than not for 6 months or longer. People with GAD also have three or more of the following symptoms (one or more in children):  Restlessness.   Fatigue.  Difficulty concentrating.   Irritability.  Muscle tension.  Difficulty sleeping or unsatisfying sleep. DIAGNOSIS GAD is diagnosed through an assessment by your health care provider. Your health care provider will ask you questions aboutyour mood,physical symptoms, and events in your life. Your health care provider may ask you about your medical history and use of alcohol or drugs, including prescription medicines. Your health care provider may also do a physical exam and blood tests. Certain medical conditions and the use of certain substances can cause symptoms similar to those associated with GAD. Your health care provider may refer you to a mental health specialist for further evaluation. TREATMENT The following therapies are usually used to treat GAD:    Medication. Antidepressant medication usually is prescribed for long-term daily control. Antianxiety medicines may be added in severe cases, especially when panic attacks occur.   Talk therapy (psychotherapy). Certain types of talk therapy can be helpful in treating GAD by providing support, education, and guidance. A form of talk therapy called cognitive behavioral therapy can teach you healthy ways to think about and react to daily life events and activities.  Stress managementtechniques. These include yoga, meditation, and exercise and can be very helpful when they are practiced regularly. A mental health specialist can help determine which treatment is best for you. Some people see improvement with one therapy. However, other people require a combination of therapies.   This information is not intended to replace advice given to you by your health care provider. Make sure you discuss any questions you have with your health care provider.   Document Released: 11/16/2012 Document Revised: 08/12/2014 Document Reviewed: 11/16/2012 Elsevier Interactive Patient Education 2016 Elsevier Inc.  

## 2015-12-07 NOTE — Progress Notes (Signed)
Patient ID: Teresa Riddle, female   DOB: [birth year], 30 y.o.   MRN: [id number]   Sidonia Knake, is a 30 y.o. female  [id number]  [id number]  DOB - 19-Mar-1986  Chief Complaint  Patient presents with  . Follow-up    ED - Anxiety        Subjective:  Chief Complaint and HPI: Teresa Riddle is a 30 y.o. female here today to establish care and for a follow up visit after being seen in the ED on 12/05/2015.  She presented to the ED on 12/05/2015 with palpitations, dizziness, SOB, and CP after receiving news that her cousin in his 73s had died suddenly from an asthma attack.  This was compounded by the fact that she has a son with asthma.  His asthma is well-controlled and he sees his doctor regularly.  EKG and enzymes were negative and her s/sx resolved with Ativan.  PMH is unremarkable. She denies previous episodes similar to this.   She is a single parent of a 32 and 14 year old.  Father of the children is not really involved in children's lives.  He was shot in the head 07/2015.  He survived and has residual hearing loss and facial disfiguring.  This too is a stressor.  He has been unable to work or provide any financial support since his injury.  She is unsure if his disabilities will be permannent, but since his injury, she has to work more to provide for herself and her children.  She has a good support system with her family.  She has a strong faith.  She denies SI/HI.  She has never been treated for anxiety/depression but sometimes wonders if she needs to be.  She admits to poor self-care because she is always caring for others.     ED/Hospital notes reviewed.   ROS:   Constitutional:  No f/c, No night sweats, No unexplained weight loss. EENT:  No vision changes, No blurry vision, No hearing changes. No mouth, throat, or ear problems.  Respiratory: No cough, No SOB since ED visit.  Cardiac: No CP, no palpitations since ED visit GI:  No abd pain, No N/V/D. GU: No Urinary  s/sx Musculoskeletal: No joint pain Neuro: No headache, no dizziness, no motor weakness.  Skin: No rash Endocrine:  No polydipsia. No polyuria.  Psych: Denies SI/HI   ALLERGIES: No Known Allergies  PAST MEDICAL HISTORY: No past medical history on file.  MEDICATIONS AT HOME: Prior to Admission medications   Medication Sig Start Date End Date Taking? Authorizing Provider  ibuprofen (ADVIL,MOTRIN) 200 MG tablet Take 400 mg by mouth every 6 (six) hours as needed for fever, headache, mild pain, moderate pain or cramping.    Yes Historical Provider, MD  ALPRAZolam (XANAX) 0.25 MG tablet Take 1 tablet (0.25 mg total) by mouth 3 (three) times daily as needed for anxiety. 12/07/15   Argentina Donovan, PA-C     Objective:  EXAM:   Filed Vitals:   12/07/15 1204  BP: 118/79  Pulse: 76  Temp: 97.8 F (36.6 C)  TempSrc: Oral  Weight: 246 lb 9.6 oz (111.857 kg)    General appearance : A&OX3. NAD. Non-toxic-appearing HEENT: Atraumatic and Normocephalic.  PERRLA. EOM intact.  TM clear B. Mouth-MMM, post pharynx WNL w/o erythema, No PND. Neck: supple, no JVD. No cervical lymphadenopathy. No thyromegaly Chest/Lungs:  Breathing-non-labored, Good air entry bilaterally, breath sounds normal without rales, rhonchi, or wheezing  CVS: S1 S2 regular, no murmurs, gallops, rubs  Extremities: Bilateral Lower Ext shows no edema, both legs are warm to touch with = pulse throughout Neurology:  CN II-XII grossly intact, Non focal.   Psych:  TP linear. J/I WNL. Normal speech. Appropriate eye contact and affect.  Skin:  No Rash   Assessment & Plan   1. Adjustment disorder with anxious mood Discussed stress/anxiety management techniques such as deep breathing, healthy nutrition, self-care, exercise, etc.  She will work on these.  Consider counseling or SSRI if these alone don't provide enough help(she chose to defer for now). Recommended [employer] to help her with self-care. She does not  drink alcohol.  Use Xanax sparingly.  Discussed chemical dependence properties.  This may be a short term-exacerbated stress/grief reaction.  We will see how she is doing in a few weeks.  - ALPRAZolam (XANAX) 0.25 MG tablet; Take 1 tablet (0.25 mg total) by mouth 3 (three) times daily as needed for anxiety.  Dispense: 30 tablet; Refill: 0  Patient have been counseled extensively about nutrition and exercise  Return in about 3 weeks (around 12/28/2015) for f/up appt with me/Mayce Noyes.  The patient was given clear instructions to go to ER or return to medical center if symptoms don't improve, worsen or new problems develop. The patient verbalized understanding. The patient was told to call to get lab results if they haven't heard anything in the next week.     Freeman Caldron, PA-C [facility] [place], [place]   12/07/2015, 1:28 PM

## 2016-01-29 ENCOUNTER — Ambulatory Visit: Payer: Self-pay | Attending: Family Medicine | Admitting: Family Medicine

## 2016-01-29 ENCOUNTER — Encounter: Payer: Self-pay | Admitting: Family Medicine

## 2016-01-29 VITALS — BP 124/82 | HR 92 | Temp 98.4°F | Resp 18 | Ht 64.0 in | Wt 249.2 lb

## 2016-01-29 DIAGNOSIS — R42 Dizziness and giddiness: Secondary | ICD-10-CM

## 2016-01-29 DIAGNOSIS — F411 Generalized anxiety disorder: Secondary | ICD-10-CM

## 2016-01-29 MED ORDER — BUSPIRONE HCL 7.5 MG PO TABS: 7.5000 mg | ORAL_TABLET | Freq: Two times a day (BID) | ORAL | Status: DC

## 2016-01-29 NOTE — Patient Instructions (Signed)
Generalized Anxiety Disorder Generalized anxiety disorder (GAD) is a mental disorder. It interferes with life functions, including relationships, work, and school. GAD is different from normal anxiety, which everyone experiences at some point in their lives in response to specific life events and activities. Normal anxiety actually helps us prepare for and get through these life events and activities. Normal anxiety goes away after the event or activity is over.  GAD causes anxiety that is not necessarily related to specific events or activities. It also causes excess anxiety in proportion to specific events or activities. The anxiety associated with GAD is also difficult to control. GAD can vary from mild to severe. People with severe GAD can have intense waves of anxiety with physical symptoms (panic attacks).  SYMPTOMS The anxiety and worry associated with GAD are difficult to control. This anxiety and worry are related to many life events and activities and also occur more days than not for 6 months or longer. People with GAD also have three or more of the following symptoms (one or more in children):  Restlessness.   Fatigue.  Difficulty concentrating.   Irritability.  Muscle tension.  Difficulty sleeping or unsatisfying sleep. DIAGNOSIS GAD is diagnosed through an assessment by your health care provider. Your health care provider will ask you questions aboutyour mood,physical symptoms, and events in your life. Your health care provider may ask you about your medical history and use of alcohol or drugs, including prescription medicines. Your health care provider may also do a physical exam and blood tests. Certain medical conditions and the use of certain substances can cause symptoms similar to those associated with GAD. Your health care provider may refer you to a mental health specialist for further evaluation. TREATMENT The following therapies are usually used to treat GAD:    Medication. Antidepressant medication usually is prescribed for long-term daily control. Antianxiety medicines may be added in severe cases, especially when panic attacks occur.   Talk therapy (psychotherapy). Certain types of talk therapy can be helpful in treating GAD by providing support, education, and guidance. A form of talk therapy called cognitive behavioral therapy can teach you healthy ways to think about and react to daily life events and activities.  Stress managementtechniques. These include yoga, meditation, and exercise and can be very helpful when they are practiced regularly. A mental health specialist can help determine which treatment is best for you. Some people see improvement with one therapy. However, other people require a combination of therapies.   This information is not intended to replace advice given to you by your health care provider. Make sure you discuss any questions you have with your health care provider.   Document Released: 11/16/2012 Document Revised: 08/12/2014 Document Reviewed: 11/16/2012 Elsevier Interactive Patient Education 2016 Elsevier Inc.  

## 2016-01-29 NOTE — Progress Notes (Signed)
Pt here for lightheadedness that has been present for 2 weeks and occurs almost every other day. She states she has swelling in her feet that she noticed last week. Pt states she has a headache rated at a 3. She also reports that her anxiety has gotten worse. Pt has not taken any medication today.

## 2016-01-29 NOTE — Progress Notes (Signed)
Subjective:  Patient ID: Teresa Riddle, female    DOB: [birth year]  Age: 30 y.o. MRN: [id number]  CC: Dizziness   HPI Teresa Riddle presents for Follow-up visit after being seen last month for anxiety precipitated by the loss of her cousin to asthma. She was seen by the PA and placed on Xanax which she takes intermittently.  She complains of palpitations, rapid breathing, chest tightness which occurs spontaneously and she has also noticed insomnia for the last 2 weeks and associated headaches and lightheadedness. States on one occassion she felt Xanax was not acting fast enough.  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  History reviewed. No pertinent past medical history.   Outpatient Prescriptions Prior to Visit  Medication Sig Dispense Refill  . ALPRAZolam (XANAX) 0.25 MG tablet Take 1 tablet (0.25 mg total) by mouth 3 (three) times daily as needed for anxiety. 30 tablet 0  . ibuprofen (ADVIL,MOTRIN) 200 MG tablet Take 400 mg by mouth every 6 (six) hours as needed for fever, headache, mild pain, moderate pain or cramping.      No facility-administered medications prior to visit.    ROS Review of Systems  Constitutional: Negative for activity change and appetite change.  HENT: Negative for sinus pressure and sore throat.   Respiratory: Negative for chest tightness, shortness of breath and wheezing.   Cardiovascular: Negative for chest pain and palpitations.  Gastrointestinal: Negative for abdominal pain, constipation and abdominal distention.  Genitourinary: Negative.   Musculoskeletal: Negative.   Psychiatric/Behavioral: Negative for behavioral problems and dysphoric mood. The patient is nervous/anxious.     Objective:  BP 124/82 mmHg  Pulse 92  Temp(Src) 98.4 F (36.9 C) (Oral)  Resp 18  Ht 5\' 4"  (1.626 m)  Wt 249 lb 3.2 oz (113.036 kg)  BMI 42.75  kg/m2  SpO2 99%  BP/Weight 01/29/2016 [id number]  Systolic BP [id number] [id number] [id number]  Diastolic BP 82 79 64  Wt. (Lbs) 249.2 246.6 260  BMI 42.75 42.31 44.61      Physical Exam  Constitutional: She is oriented to person, place, and time. She appears well-developed and well-nourished.  Cardiovascular: Normal rate, normal heart sounds and intact distal pulses.   No murmur heard. Pulmonary/Chest: Effort normal and breath sounds normal. She has no wheezes. She has no rales. She exhibits no tenderness.  Abdominal: Soft. Bowel sounds are normal. She exhibits no distension and no mass. There is no tenderness.  Musculoskeletal: Normal range of motion.  Neurological: She is alert and oriented to person, place, and time.  Psychiatric:  Anxious, teary     Assessment & Plan:   1. Generalized anxiety disorder She does have anxiety coupled with insomnia which could also be precipitating the headaches she is having. Currently taking Xanax as needed which she received from the PA; I have informed her I would not be keeping her on Xanax due to its habit-forming nature. Provided resources for therapy in the community as she does have some underlying generalized anxiety which has been superimposed by recent bereavement. - busPIRone (BUSPAR) 7.5 MG tablet; Take 1 tablet (7.5 mg total) by mouth 2 (two) times daily.  Dispense: 60 tablet; Refill: 1  2. Lightheadedness Could be secondary to dehydration Advised increase fluid intake   Meds ordered this encounter  Medications  . busPIRone (BUSPAR) 7.5 MG tablet    Sig: Take  1 tablet (7.5 mg total) by mouth 2 (two) times daily.    Dispense:  60 tablet    Refill:  1    Follow-up: 1 month   Arnoldo Morale MD

## 2016-01-31 ENCOUNTER — Telehealth: Payer: Self-pay | Admitting: Family Medicine

## 2016-01-31 DIAGNOSIS — F411 Generalized anxiety disorder: Secondary | ICD-10-CM

## 2016-01-31 MED ORDER — IBUPROFEN 600 MG PO TABS: 600.0000 mg | ORAL_TABLET | Freq: Three times a day (TID) | ORAL | Status: DC | PRN

## 2016-01-31 MED ORDER — BUSPIRONE HCL 7.5 MG PO TABS: 7.5000 mg | ORAL_TABLET | Freq: Two times a day (BID) | ORAL | Status: DC

## 2016-01-31 MED FILL — busPIRone HCL 7.5 MG TABS: 7.5 | 30 days supply | Qty: 60 | Fill #0

## 2016-01-31 MED FILL — IBUPROFEN 600 MG TABLET: 600 | 20 days supply | Qty: 60 | Fill #0

## 2016-01-31 NOTE — Telephone Encounter (Signed)
It is cheaper to obtain BuSpar from our pharmacy (especially if she has no medical coverage) and so I have sent both her ibuprofen and BuSpar prescriptions to the pharmacy on site.

## 2016-01-31 NOTE — Telephone Encounter (Signed)
Patient called requesting medication for headaches, pt would like to get ibuprofen 800mg , and states busPIRone (BUSPAR) 7.5 MG tablet is to expensive for her so is requesting medication change. Please f/up

## 2016-01-31 NOTE — Telephone Encounter (Signed)
Will forward request to Dr. Jarold Song.

## 2016-02-01 NOTE — Telephone Encounter (Signed)
Writer spoke with patient to inform her that her prescriptions have been sent to our on site pharmacy due to cost.  Patient stated understanding and will pick up the prescriptions.

## 2016-07-19 ENCOUNTER — Encounter: Payer: Self-pay | Admitting: Family Medicine

## 2016-07-19 ENCOUNTER — Ambulatory Visit: Payer: Medicaid Other | Attending: Family Medicine | Admitting: Family Medicine

## 2016-07-19 ENCOUNTER — Encounter: Payer: Self-pay | Admitting: Licensed Clinical Social Worker

## 2016-07-19 DIAGNOSIS — K219 Gastro-esophageal reflux disease without esophagitis: Secondary | ICD-10-CM | POA: Diagnosis not present

## 2016-07-19 DIAGNOSIS — G4700 Insomnia, unspecified: Secondary | ICD-10-CM | POA: Diagnosis not present

## 2016-07-19 DIAGNOSIS — Z79899 Other long term (current) drug therapy: Secondary | ICD-10-CM | POA: Diagnosis not present

## 2016-07-19 DIAGNOSIS — Z0001 Encounter for general adult medical examination with abnormal findings: Secondary | ICD-10-CM | POA: Diagnosis not present

## 2016-07-19 DIAGNOSIS — F411 Generalized anxiety disorder: Secondary | ICD-10-CM | POA: Diagnosis not present

## 2016-07-19 DIAGNOSIS — G4709 Other insomnia: Secondary | ICD-10-CM

## 2016-07-19 DIAGNOSIS — E669 Obesity, unspecified: Secondary | ICD-10-CM | POA: Insufficient documentation

## 2016-07-19 MED ORDER — BUSPIRONE HCL 7.5 MG PO TABS: 7.5000 mg | ORAL_TABLET | Freq: Two times a day (BID) | ORAL | 3 refills | Status: DC

## 2016-07-19 MED ORDER — TRAZODONE HCL 50 MG PO TABS: 50.0000 mg | ORAL_TABLET | Freq: Every day | ORAL | 3 refills | Status: DC

## 2016-07-19 MED ORDER — OMEPRAZOLE 20 MG PO CPDR: 20.0000 mg | DELAYED_RELEASE_CAPSULE | Freq: Every day | ORAL | 3 refills | Status: DC

## 2016-07-19 MED FILL — traZODone HCL 50 MG TABS: 50 | 30 days supply | Qty: 30 | Fill #0

## 2016-07-19 MED FILL — IBUPROFEN 600 MG TABLET: 600 | 20 days supply | Qty: 60 | Fill #1

## 2016-07-19 MED FILL — busPIRone HCL 7.5 MG TABS: 7.5 | 30 days supply | Qty: 60 | Fill #0

## 2016-07-19 MED FILL — OMEPRAZOLE DR 20 MG CAPSULE: 20 | 30 days supply | Qty: 30 | Fill #0

## 2016-07-19 NOTE — Progress Notes (Signed)
Subjective:  Patient ID: Teresa Riddle, female    DOB: 07-01-1986  Age: 30 y.o. MRN: [id number]  CC: Anxiety; Gastroesophageal Reflux; Arm Pain (right upper arm); and Insomnia   HPI Teresa Riddle is a 30 year old female with Obesity, generalized anxiety here for a follow up visit.She was commenced on Buspar for anxiety at her last visit which she states is not working but on further questioning she has been taking it as needed rather than daily. Complains anxiety prevents her from sleeping. She has lost 2 cousins in 6 months and her kids dad was shot last Christmas (which he survived) and she broke up with her boyfriend 2 months ago. 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  Also complains of reflux symptoms and has been using OTC Prilosec with some relief.  History reviewed. No pertinent past medical history.  Past Surgical History:  Procedure Laterality Date  . DILATION AND CURETTAGE OF UTERUS      No Known Allergies   Outpatient Medications Prior to Visit  Medication Sig Dispense Refill  . ibuprofen (ADVIL,MOTRIN) 600 MG tablet Take 1 tablet (600 mg total) by mouth every 8 (eight) hours as needed for fever, headache, mild pain, moderate pain or cramping. 60 tablet 1  . busPIRone (BUSPAR) 7.5 MG tablet Take 1 tablet (7.5 mg total) by mouth 2 (two) times daily. 60 tablet 1  . ALPRAZolam (XANAX) 0.25 MG tablet Take 1 tablet (0.25 mg total) by mouth 3 (three) times daily as needed for anxiety. 30 tablet 0   No facility-administered medications prior to visit.     ROS Review of Systems  Constitutional: Negative for activity change, appetite change and fatigue.  HENT: Negative for congestion, sinus pressure and sore throat.   Eyes: Negative for visual disturbance.  Respiratory: Negative for cough, chest tightness, shortness of breath and wheezing.   Cardiovascular: Negative for chest pain and palpitations.    Gastrointestinal: Negative for abdominal distention, abdominal pain and constipation.  Endocrine: Negative for polydipsia.  Genitourinary: Negative for dysuria and frequency.  Musculoskeletal: Negative for arthralgias and back pain.  Skin: Negative for rash.  Neurological: Negative for tremors, light-headedness and numbness.  Hematological: Does not bruise/bleed easily.  Psychiatric/Behavioral: Positive for dysphoric mood. Negative for agitation and behavioral problems. The patient is nervous/anxious.     Objective:  BP 126/85 (BP Location: Right Arm, Patient Position: Sitting, Cuff Size: Large)   Pulse 79   Temp 98.7 F (37.1 C) (Oral)   Ht 5' 4.25" (1.632 m)   Wt 244 lb 9.6 oz (110.9 kg)   SpO2 100%   BMI 41.66 kg/m   BP/Weight 07/19/2016 [id number]  Systolic BP 123XX123 [id number] 123456  Diastolic BP 85 82 79  Wt. (Lbs) 244.6 249.2 246.6  BMI 41.66 42.75 42.31      Physical Exam  Constitutional: She is oriented to person, place, and time. She appears well-developed and well-nourished.  Obese  Cardiovascular: Normal rate, normal heart sounds and intact distal pulses.   No murmur heard. Pulmonary/Chest: Effort normal and breath sounds normal. She has no wheezes. She has no rales. She exhibits no tenderness.  Abdominal: Soft. Bowel sounds are normal. She exhibits no distension and no mass. There is no tenderness.  Musculoskeletal: Normal range of motion.  Neurological: She is alert and oriented to person, place, and time.  Psychiatric: Her mood appears anxious.     Assessment & Plan:  1. Generalized anxiety disorder Uncontrolled due to underlying stressors, recent breavements Taking Buspar prn rather than daily and correct administration has been emphasized LCSW called in for counseling and therapy - busPIRone (BUSPAR) 7.5 MG tablet; Take 1 tablet (7.5 mg total) by mouth 2 (two) times daily.  Dispense: 60 tablet; Refill: 3  2. Other insomnia Secondary to Generalized  anxiety Trazodone added  3. Gastroesophageal reflux disease without esophagitis Placed on Prilosec Avoid late meals and recumbent position up to 2 hours after meals.  4. Obesity Reduce portion sizes and increase physical activity  Meds ordered this encounter  Medications  . busPIRone (BUSPAR) 7.5 MG tablet    Sig: Take 1 tablet (7.5 mg total) by mouth 2 (two) times daily.    Dispense:  60 tablet    Refill:  3  . traZODone (DESYREL) 50 MG tablet    Sig: Take 1 tablet (50 mg total) by mouth at bedtime.    Dispense:  30 tablet    Refill:  3  . omeprazole (PRILOSEC) 20 MG capsule    Sig: Take 1 capsule (20 mg total) by mouth daily.    Dispense:  30 capsule    Refill:  3    Follow-up: Return in about 1 month (around 08/19/2016) for Complete physical exam.   Arnoldo Morale MD

## 2016-07-19 NOTE — Patient Instructions (Signed)
Generalized Anxiety Disorder Generalized anxiety disorder (GAD) is a mental disorder. It interferes with life functions, including relationships, work, and school. GAD is different from normal anxiety, which everyone experiences at some point in their lives in response to specific life events and activities. Normal anxiety actually helps us prepare for and get through these life events and activities. Normal anxiety goes away after the event or activity is over.  GAD causes anxiety that is not necessarily related to specific events or activities. It also causes excess anxiety in proportion to specific events or activities. The anxiety associated with GAD is also difficult to control. GAD can vary from mild to severe. People with severe GAD can have intense waves of anxiety with physical symptoms (panic attacks).  SYMPTOMS The anxiety and worry associated with GAD are difficult to control. This anxiety and worry are related to many life events and activities and also occur more days than not for 6 months or longer. People with GAD also have three or more of the following symptoms (one or more in children):  Restlessness.   Fatigue.  Difficulty concentrating.   Irritability.  Muscle tension.  Difficulty sleeping or unsatisfying sleep. DIAGNOSIS GAD is diagnosed through an assessment by your health care provider. Your health care provider will ask you questions aboutyour mood,physical symptoms, and events in your life. Your health care provider may ask you about your medical history and use of alcohol or drugs, including prescription medicines. Your health care provider may also do a physical exam and blood tests. Certain medical conditions and the use of certain substances can cause symptoms similar to those associated with GAD. Your health care provider may refer you to a mental health specialist for further evaluation. TREATMENT The following therapies are usually used to treat GAD:    Medication. Antidepressant medication usually is prescribed for long-term daily control. Antianxiety medicines may be added in severe cases, especially when panic attacks occur.   Talk therapy (psychotherapy). Certain types of talk therapy can be helpful in treating GAD by providing support, education, and guidance. A form of talk therapy called cognitive behavioral therapy can teach you healthy ways to think about and react to daily life events and activities.  Stress managementtechniques. These include yoga, meditation, and exercise and can be very helpful when they are practiced regularly. A mental health specialist can help determine which treatment is best for you. Some people see improvement with one therapy. However, other people require a combination of therapies. This information is not intended to replace advice given to you by your health care provider. Make sure you discuss any questions you have with your health care provider. Document Released: 11/16/2012 Document Revised: 08/12/2014 Document Reviewed: 11/16/2012 Elsevier Interactive Patient Education  2017 [employer] Inc.  

## 2016-07-19 NOTE — Progress Notes (Signed)
Only taking buspar intermit. Crying today- feels really anxious

## 2016-07-19 NOTE — BH Specialist Note (Signed)
Session Start time: 12:30 pm   End Time: 1:00 pm Total Time:  30 minutes Type of Service: [place] Interpreter: No.   Interpreter Name & Language: N/A # [facility] Visits July 2017-June 2018: 1st   SUBJECTIVE: Teresa Riddle is a 30 y.o. female  Pt. was referred by Dr. Jarold Song for:  anxiety and depression. Pt. reports the following symptoms/concerns: difficulty sleeping, nightmares, nervousness, panic attacks, and racing thoughts Duration of problem:  6 months Severity: severe Previous treatment: None reported   OBJECTIVE: Mood: Anxious & Affect: Tearful  Risk of harm to self or others: Pt denied SI/HI Assessments administered: PHQ-9; GAD-7  LIFE CONTEXT:  Family & Social: Pt resides with her two minor children and adult sister. Pt receives emotional support from her mother who visits; however, resides four hours away. School/ Work: Pt recently quit her job due to increased stress. 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 She denies substance use Life changes: Pt is grieving the loss of two cousins within six month time frame who passed from health concerns. This has triggered pt's anxiety What is important to pt/family (values): Family, Health, Spirituality   GOALS ADDRESSED:  Decrease symptoms of depression Decrease symptoms of anxiety  INTERVENTIONS: Solution Focused, Strength-based and Supportive   ASSESSMENT:  Pt currently experiencing depression and anxiety triggered by stress and the loss of two cousins in the last six months. Pt reports difficulty sleeping, nightmares, nervousness, panic attacks, and racing thoughts. Pt may benefit from psychoeducation, psychotherapy, and medication management. LCSWA educated pt on the cycle of depression and anxiety and discussed healthy coping skills to decrease symptoms of anxiety. Pt reported that she was taking her medication incorrectly (PRN); however, has  spoken to PCP and verbalized that she is to take meds daily. Pt was able to identify healthy strategies (i.e mindfulness applications) to cope with stress. Pt was  provided with crisis intervention resources and was encouraged to initiate behavioral health services in congruence with medication management.      PLAN: 1. F/U with behavioral health clinician: Pt was encouraged to contact [place] if symptoms worsen or fail to improve to schedule behavioral appointments at [facility]. 2. Behavioral Health meds: Buspar 3. Behavioral recommendations: LCSWA recommends that pt apply healthy coping skills discussed. Pt is encouraged to schedule follow up appointment with LCSWA 4. Referral: Brief Counseling/Psychotherapy, [employer], Problem-solving teaching/coping strategies, Psychoeducation and Supportive Counseling 5. From scale of 1-10, how likely are you to follow plan: 9/10   Teresa Riddle, MSW, [facility]  Clinical Social Worker 07/19/16 4:25 PM  Warmhandoff:   Warm Hand Off Completed.

## 2016-07-27 ENCOUNTER — Emergency Department (HOSPITAL_COMMUNITY)
Admission: EM | Admit: 2016-07-27 | Discharge: 2016-07-27 | Disposition: A | Discharge status: Home / Self Care | Payer: Medicaid Other | Attending: Emergency Medicine | Admitting: Emergency Medicine

## 2016-07-27 ENCOUNTER — Encounter (HOSPITAL_COMMUNITY): Payer: Self-pay | Admitting: *Deleted

## 2016-07-27 DIAGNOSIS — F064 Anxiety disorder due to known physiological condition: Secondary | ICD-10-CM | POA: Insufficient documentation

## 2016-07-27 DIAGNOSIS — F411 Generalized anxiety disorder: Secondary | ICD-10-CM | POA: Diagnosis not present

## 2016-07-27 DIAGNOSIS — F410 Panic disorder [episodic paroxysmal anxiety] without agoraphobia: Secondary | ICD-10-CM | POA: Diagnosis present

## 2016-07-27 HISTORY — DX: Anxiety disorder, unspecified: F41.9

## 2016-07-27 MED ORDER — ALPRAZOLAM 0.25 MG PO TABS: 0.2500 mg | ORAL_TABLET | Freq: Three times a day (TID) | ORAL | 0 refills | Status: DC | PRN

## 2016-07-27 NOTE — ED Triage Notes (Signed)
Pt stated "I saw my doctor this week and she gave me risperdal and trazodone.  I've been having bad dreams about snakes and dead people."  Mother stated "she feels like something is wrong with her heart because she had a cousin her age die recently."

## 2016-07-27 NOTE — ED Provider Notes (Signed)
[facility] DEPT Provider Note   CSN: [id number] Arrival date & time: 07/27/16  0301     History   Chief Complaint Chief Complaint  Patient presents with  . Panic Attack    HPI Teresa Riddle is a 30 y.o. female.  30 year old female with history of anxiety and recently started on BuSpar presents with worsening symptoms of feeling of the pending doom as well as just generalized anxiety. Denies any suicidal or homicidal ideations. States that she's been under greater stress recently and is having trouble sleeping. Is taking trazodone for this without help. Denies any current use of alcohol or tobacco. States that she gets up in the night and feels like the world is closing in on her. Denies any sleep apnea symptoms. No anginal symptoms at this time.      Past Medical History:  Diagnosis Date  . Anxiety     Patient Active Problem List   Diagnosis Date Noted  . Insomnia 07/19/2016  . GERD (gastroesophageal reflux disease) 07/19/2016  . Morbid obesity ([place]) 07/19/2016  . Generalized anxiety disorder 01/29/2016    Past Surgical History:  Procedure Laterality Date  . DILATION AND CURETTAGE OF UTERUS      OB History    No data available       Home Medications    Prior to Admission medications   Medication Sig Start Date End Date Taking? Authorizing Provider  busPIRone (BUSPAR) 7.5 MG tablet Take 1 tablet (7.5 mg total) by mouth 2 (two) times daily. 07/19/16   Arnoldo Morale, MD  ibuprofen (ADVIL,MOTRIN) 600 MG tablet Take 1 tablet (600 mg total) by mouth every 8 (eight) hours as needed for fever, headache, mild pain, moderate pain or cramping. 01/31/16   Arnoldo Morale, MD  omeprazole (PRILOSEC) 20 MG capsule Take 1 capsule (20 mg total) by mouth daily. 07/19/16   Arnoldo Morale, MD  traZODone (DESYREL) 50 MG tablet Take 1 tablet (50 mg total) by mouth at bedtime. 07/19/16   Arnoldo Morale, MD    Family History No family history on file.  Social History Social  History  Substance Use Topics  . Smoking status: Never Smoker  . Smokeless tobacco: Never Used  . Alcohol use No     Allergies   Patient has no known allergies.   Review of Systems Review of Systems  All other systems reviewed and are negative.    Physical Exam Updated Vital Signs BP 116/65 (BP Location: Right Arm)   Pulse 88   Temp 98.3 F (36.8 C) (Oral)   Resp 16   Ht 5\' 4"  (1.626 m)   Wt 111.6 kg   SpO2 100%   BMI 42.23 kg/m   Physical Exam  Constitutional: She is oriented to person, place, and time. She appears well-developed and well-nourished.  Non-toxic appearance. No distress.  HENT:  Head: Normocephalic and atraumatic.  Eyes: Conjunctivae, EOM and lids are normal. Pupils are equal, round, and reactive to light.  Neck: Normal range of motion. Neck supple. No tracheal deviation present. No thyroid mass present.  Cardiovascular: Normal rate, regular rhythm and normal heart sounds.  Exam reveals no gallop.   No murmur heard. Pulmonary/Chest: Effort normal and breath sounds normal. No stridor. No respiratory distress. She has no decreased breath sounds. She has no wheezes. She has no rhonchi. She has no rales.  Abdominal: Soft. Normal appearance and bowel sounds are normal. She exhibits no distension. There is no tenderness. There is no rebound and no CVA tenderness.  Musculoskeletal: Normal range of motion. She exhibits no edema or tenderness.  Neurological: She is alert and oriented to person, place, and time. She has normal strength. No cranial nerve deficit or sensory deficit. GCS eye subscore is 4. GCS verbal subscore is 5. GCS motor subscore is 6.  Skin: Skin is warm and dry. No abrasion and no rash noted.  Psychiatric: Her speech is normal and behavior is normal. Her mood appears anxious. She expresses no suicidal plans and no homicidal plans.  Nursing note and vitals reviewed.    ED Treatments / Results  Labs (all labs ordered are listed, but only  abnormal results are displayed) Labs Reviewed - No data to display  EKG  EKG Interpretation  Date/Time:  Saturday July 27 2016 07:21:14 EST Ventricular Rate:  82 PR Interval:    QRS Duration: 82 QT Interval:  379 QTC Calculation: 443 R Axis:   41 Text Interpretation:  Sinus rhythm Borderline T abnormalities, anterior leads No significant change since last tracing Confirmed by Torrez Renfroe  MD, Ronnette Rump ([id number]) on 07/27/2016 7:45:24 AM       Radiology No results found.  Procedures Procedures (including critical care time)  Medications Ordered in ED Medications - No data to display   Initial Impression / Assessment and Plan / ED Course  I have reviewed the triage vital signs and the nursing notes.  Pertinent labs & imaging results that were available during my care of the patient were reviewed by me and considered in my medical decision making (see chart for details).  Clinical Course     Patient without acute psychiatric emergency here. Will prescribe short course of Xanax and patient to follow-up with her doctor.  Final Clinical Impressions(s) / ED Diagnoses   Final diagnoses:  None    New Prescriptions New Prescriptions   No medications on file     Lacretia Leigh, MD 07/27/16 [phone]

## 2016-07-27 NOTE — ED Notes (Signed)
Mother stated "she got a lot of personal things going on.  She recently broke up with her boyfriend and he keeps calling her."

## 2016-08-16 ENCOUNTER — Encounter: Payer: Self-pay | Admitting: Family Medicine

## 2016-08-27 ENCOUNTER — Other Ambulatory Visit (HOSPITAL_COMMUNITY)
Admission: RE | Admit: 2016-08-27 | Discharge: 2016-08-27 | Disposition: A | Discharge status: Home / Self Care | Payer: Medicaid Other | Source: Ambulatory Visit | Attending: Family Medicine | Admitting: Family Medicine

## 2016-08-27 ENCOUNTER — Ambulatory Visit: Payer: Medicaid Other | Attending: Family Medicine | Admitting: Family Medicine

## 2016-08-27 VITALS — BP 117/71 | HR 69 | Temp 98.4°F | Ht 64.0 in | Wt 243.2 lb

## 2016-08-27 DIAGNOSIS — Z1151 Encounter for screening for human papillomavirus (HPV): Secondary | ICD-10-CM | POA: Insufficient documentation

## 2016-08-27 DIAGNOSIS — Z124 Encounter for screening for malignant neoplasm of cervix: Secondary | ICD-10-CM

## 2016-08-27 DIAGNOSIS — Z01419 Encounter for gynecological examination (general) (routine) without abnormal findings: Secondary | ICD-10-CM | POA: Insufficient documentation

## 2016-08-27 DIAGNOSIS — Z6841 Body Mass Index (BMI) 40.0 and over, adult: Secondary | ICD-10-CM | POA: Diagnosis not present

## 2016-08-27 DIAGNOSIS — Z30432 Encounter for removal of intrauterine contraceptive device: Secondary | ICD-10-CM

## 2016-08-27 DIAGNOSIS — Z13228 Encounter for screening for other metabolic disorders: Secondary | ICD-10-CM | POA: Diagnosis not present

## 2016-08-27 DIAGNOSIS — Z79899 Other long term (current) drug therapy: Secondary | ICD-10-CM | POA: Insufficient documentation

## 2016-08-27 DIAGNOSIS — Z113 Encounter for screening for infections with a predominantly sexual mode of transmission: Secondary | ICD-10-CM | POA: Diagnosis present

## 2016-08-27 DIAGNOSIS — Z0000 Encounter for general adult medical examination without abnormal findings: Secondary | ICD-10-CM | POA: Diagnosis not present

## 2016-08-27 NOTE — Progress Notes (Signed)
Subjective:  Patient ID: Teresa Riddle, female    DOB: 07/03/1986  Age: 30 y.o. MRN: [id number]  CC: Annual Exam (is requesting to have mirena removed today) and Oral Swelling   HPI Teresa Riddle presents for a complete physical today and would like her Mirena removed She complains of a sensation of something in her throat and having to clear her throat all the time.  Past Medical History:  Diagnosis Date  . Anxiety     Past Surgical History:  Procedure Laterality Date  . DILATION AND CURETTAGE OF UTERUS      No Known Allergies   Outpatient Medications Prior to Visit  Medication Sig Dispense Refill  . ALPRAZolam (XANAX) 0.25 MG tablet Take 1 tablet (0.25 mg total) by mouth 3 (three) times daily as needed for anxiety. 12 tablet 0  . busPIRone (BUSPAR) 7.5 MG tablet Take 1 tablet (7.5 mg total) by mouth 2 (two) times daily. 60 tablet 3  . ibuprofen (ADVIL,MOTRIN) 600 MG tablet Take 1 tablet (600 mg total) by mouth every 8 (eight) hours as needed for fever, headache, mild pain, moderate pain or cramping. 60 tablet 1  . omeprazole (PRILOSEC) 20 MG capsule Take 1 capsule (20 mg total) by mouth daily. 30 capsule 3  . traZODone (DESYREL) 50 MG tablet Take 1 tablet (50 mg total) by mouth at bedtime. 30 tablet 3   No facility-administered medications prior to visit.     ROS Review of Systems  Constitutional: Negative for activity change, appetite change and fatigue.  HENT: Negative for congestion, sinus pressure and sore throat.   Eyes: Negative for visual disturbance.  Respiratory: Negative for cough, chest tightness, shortness of breath and wheezing.   Cardiovascular: Negative for chest pain and palpitations.  Gastrointestinal: Negative for abdominal distention, abdominal pain and constipation.  Endocrine: Negative for polydipsia.  Genitourinary: Negative for dysuria and frequency.  Musculoskeletal: Negative for arthralgias and back pain.  Skin: Negative for rash.    Neurological: Negative for tremors, light-headedness and numbness.  Hematological: Does not bruise/bleed easily.  Psychiatric/Behavioral: Negative for agitation and behavioral problems.    Objective:  BP 117/71 (BP Location: Right Arm, Patient Position: Sitting, Cuff Size: Large)   Pulse 69   Temp 98.4 F (36.9 C) (Oral)   Ht 5\' 4"  (1.626 m)   Wt 243 lb 3.2 oz (110.3 kg)   SpO2 99%   BMI 41.75 kg/m   BP/Weight 08/27/2016 07/27/2016 123XX123  Systolic BP 123XX123 [id number]  Diastolic BP 71 65 85  Wt. (Lbs) 243.2 246 244.6  BMI 41.75 42.23 41.66      Physical Exam  Constitutional: She is oriented to person, place, and time. She appears well-developed and well-nourished. No distress.  HENT:  Head: Normocephalic.  Right Ear: External ear normal.  Left Ear: External ear normal.  Nose: Nose normal.  Mouth/Throat: Oropharynx is clear and moist.  Eyes: Conjunctivae and EOM are normal. Pupils are equal, round, and reactive to light.  Neck: Normal range of motion. No JVD present.  Cardiovascular: Normal rate, regular rhythm, normal heart sounds and intact distal pulses.  Exam reveals no gallop.   No murmur heard. Pulmonary/Chest: Effort normal and breath sounds normal. No respiratory distress. She has no wheezes. She has no rales. She exhibits no tenderness.  Abdominal: Soft. Bowel sounds are normal. She exhibits no distension and no mass. There is no tenderness.  Genitourinary:  Genitourinary Comments: External genitalia - normal Vagina - bloody discharge Cervix-bloody discharge, Mirena  string in situ   Musculoskeletal: Normal range of motion. She exhibits no edema or tenderness.  Neurological: She is alert and oriented to person, place, and time. She has normal reflexes.  Skin: Skin is warm and dry. She is not diaphoretic.  Psychiatric: She has a normal mood and affect.     Assessment & Plan:   1. Encounter for IUD removal Removed with the aid of a ring forcep Patient  tolerated procedure well - Cath Tip Culture  2. Annual physical exam Advised to use OTC Zyrtec for throat symptoms which could be postnasal drip.  3. Morbid obesity ([place]) Scuffs weight loss, reducing portion sizes and exercise - Amb ref to Medical Nutrition Therapy-MNT  4. Screening for metabolic disorder - COMPLETE METABOLIC PANEL WITH GFR; Future - Lipid Panel w/reflex Direct LDL; Future  5. Screening for cervical cancer - Cytology - PAP [place]   No orders of the defined types were placed in this encounter.   Follow-up: Return in about 1 month (around 09/27/2016) for follow up on anxiety.   Arnoldo Morale MD

## 2016-08-28 ENCOUNTER — Encounter: Payer: Self-pay | Admitting: Family Medicine

## 2016-08-28 ENCOUNTER — Ambulatory Visit: Payer: Medicaid Other | Attending: Family Medicine

## 2016-08-28 DIAGNOSIS — Z13228 Encounter for screening for other metabolic disorders: Secondary | ICD-10-CM | POA: Insufficient documentation

## 2016-08-28 LAB — COMPLETE METABOLIC PANEL WITH GFR
ALBUMIN: 4 g/dL (ref 3.6–5.1)
ALK PHOS: 67 U/L (ref 33–115)
ALT: 7 U/L (ref 6–29)
AST: 10 U/L (ref 10–30)
BUN: 9 mg/dL (ref 7–25)
CHLORIDE: 109 mmol/L (ref 98–110)
CO2: 25 mmol/L (ref 20–31)
Calcium: 9.5 mg/dL (ref 8.6–10.2)
Creat: 0.84 mg/dL (ref 0.50–1.10)
GFR, Est African American: >89 mL/min (ref >=60)
GLUCOSE: 94 mg/dL (ref 65–99)
POTASSIUM: 4.1 mmol/L (ref 3.5–5.3)
SODIUM: 142 mmol/L (ref 135–146)
Total Bilirubin: 0.6 mg/dL (ref 0.2–1.2)
Total Protein: 6.5 g/dL (ref 6.1–8.1)

## 2016-08-28 LAB — LIPID PANEL W/REFLEX DIRECT LDL
CHOL/HDL RATIO: 3.9 ratio (ref <5.0)
CHOLESTEROL: 172 mg/dL (ref <200)
HDL: 44 mg/dL — ABNORMAL LOW (ref >50)
LDL-Cholesterol: 109 mg/dL — ABNORMAL HIGH
NON-HDL CHOLESTEROL (CALC): 128 mg/dL (ref <130)
Triglycerides: 94 mg/dL (ref <150)

## 2016-08-28 NOTE — Progress Notes (Signed)
Patient here for lab visit only 

## 2016-08-30 ENCOUNTER — Telehealth: Payer: Self-pay

## 2016-08-30 LAB — CYTOLOGY - PAP
BACTERIAL VAGINITIS: NEGATIVE
Candida vaginitis: NEGATIVE
Chlamydia: NEGATIVE
DIAGNOSIS: NEGATIVE
HPV: NOT DETECTED
NEISSERIA GONORRHEA: NEGATIVE
Trichomonas: NEGATIVE

## 2016-08-30 NOTE — Telephone Encounter (Signed)
Writer called patient and discussed her lab results per Dr. Jarold Song.  Patient stated understanding.

## 2016-08-30 NOTE — Telephone Encounter (Signed)
-----   Message from Arnoldo Morale, MD sent at 08/29/2016 11:29 AM EST ----- Please inform the patient that labs are normal. Thank you.

## 2016-09-11 LAB — CATH TIP CULTURE

## 2016-09-11 LAB — ANAEROBIC CULTURE

## 2016-09-16 ENCOUNTER — Encounter: Payer: Medicaid Other | Attending: Family Medicine | Admitting: Registered"

## 2016-09-16 DIAGNOSIS — IMO0001 Reserved for inherently not codable concepts without codable children: Secondary | ICD-10-CM

## 2016-09-16 NOTE — Patient Instructions (Addendum)
Cholesterol tips: add more nuts, peanuts, raw almonds, walnuts, and oatmeal Aim for fish 2-3x week. Fatty fish include salmon, tuna, sardines. Other seafood is fine too. https://www.rivera-powers.org/ Consider exploring other snack options Try to get breakfast in. Smoothie recipe idea: 1/2 banana, 1/2 c frozen fruit (blueberries), ~1/2 cup greek plain yogurt, ~1 cup of greens, milk or milk alternative to desired consistency. Beverages: add more water, reduce sweet tea intake

## 2016-09-16 NOTE — Progress Notes (Signed)
Medical Nutrition Therapy:  Appt start time: 0800 end time:  0900.   Assessment:  Primary concerns today: weight loss/healthy eating. Has family hx of high cholesterol and wants to avoid that as well. Works at [employer] account [employer]. Has 2 daughters at home who enjoy healthier food and snack options.  Wt Readings from Last 3 Encounters:  09/16/16 236 lb 12.8 oz (107.4 kg)  08/27/16 243 lb 3.2 oz (110.3 kg)  07/27/16 246 lb (111.6 kg)    Preferred Learning Style:   No preference indicated   Learning Readiness:   Ready  MEDICATIONS: reviewed   DIETARY INTAKE:  Usual eating pattern includes 1-2 meals and 1-2 snacks per day.  Avoided foods include red meat and pork and doesn't like to eat left overs.   24-hr recall:  B ( AM): none  Snk ( AM): chips  L ( PM): [place] food OR sandwich OR pizza Snk ( PM): chip D ( PM): chicken sauteed, bbq or baked, vegetable, potato or rice Snk ( PM): chip Beverages: sweet tea (has replaced soda habit due to preference)  Usual physical activity: used to run, 2014 chin problems. Doesn't like going to the gym and using the machines.  Estimated energy needs: 1600 calories 180 g carbohydrates 100 g protein 53 g fat  Progress Towards Goal(s):  In progress.   Nutritional Diagnosis:  [place]-3.3 Overweight/obesity As related to excess caloric intake.  As evidenced by diet recal.    Intervention:  Nutrition Education. "Plate Method" for portion control. Mindful eating to eat appropriate meal sizes. Sugar-sweetened beverages and concentrated sweets. Healthy fats and fiber role in controlling cholesterol.  Plan: Cholesterol tips: add more nuts, peanuts, raw almonds, walnuts, and oatmeal Aim for fish 2-3x week. Fatty fish include salmon, tuna, sardines. Other seafood is fine too. https://www.rivera-powers.org/ Consider exploring other snack options Try to get breakfast in. Smoothie recipe idea: 1/2 banana, 1/2 c frozen fruit (blueberries), ~1/2 cup greek  plain yogurt, ~1 cup of greens, milk or milk alternative to desired consistency. Beverages: add more water, reduce sweet tea intake   Teaching Method Utilized:  Visual Auditory  Handouts given during visit include:  My plate  Barriers to learning/adherence to lifestyle change: none  Demonstrated degree of understanding via:  Teach Back   Monitoring/Evaluation:  Dietary intake, exercise, and body weight in 1 month(s).

## 2016-09-30 ENCOUNTER — Ambulatory Visit: Payer: Medicaid Other | Attending: Family Medicine | Admitting: Family Medicine

## 2016-09-30 ENCOUNTER — Encounter: Payer: Self-pay | Admitting: Family Medicine

## 2016-09-30 VITALS — BP 112/71 | HR 84 | Temp 98.2°F | Ht 64.0 in | Wt 236.4 lb

## 2016-09-30 DIAGNOSIS — Z3041 Encounter for surveillance of contraceptive pills: Secondary | ICD-10-CM | POA: Diagnosis not present

## 2016-09-30 DIAGNOSIS — J302 Other seasonal allergic rhinitis: Secondary | ICD-10-CM

## 2016-09-30 DIAGNOSIS — J309 Allergic rhinitis, unspecified: Secondary | ICD-10-CM | POA: Insufficient documentation

## 2016-09-30 DIAGNOSIS — Z79899 Other long term (current) drug therapy: Secondary | ICD-10-CM | POA: Diagnosis not present

## 2016-09-30 DIAGNOSIS — F411 Generalized anxiety disorder: Secondary | ICD-10-CM | POA: Insufficient documentation

## 2016-09-30 DIAGNOSIS — K219 Gastro-esophageal reflux disease without esophagitis: Secondary | ICD-10-CM | POA: Diagnosis not present

## 2016-09-30 DIAGNOSIS — Z6841 Body Mass Index (BMI) 40.0 and over, adult: Secondary | ICD-10-CM | POA: Insufficient documentation

## 2016-09-30 DIAGNOSIS — Z5189 Encounter for other specified aftercare: Secondary | ICD-10-CM | POA: Diagnosis present

## 2016-09-30 DIAGNOSIS — R21 Rash and other nonspecific skin eruption: Secondary | ICD-10-CM | POA: Diagnosis not present

## 2016-09-30 MED ORDER — NORGESTIMATE-ETH ESTRADIOL 0.25-35 MG-MCG PO TABS: 1.0000 | ORAL_TABLET | Freq: Every day | ORAL | 6 refills | Status: DC

## 2016-09-30 MED ORDER — CETIRIZINE HCL 10 MG PO TABS: 10.0000 mg | ORAL_TABLET | Freq: Every day | ORAL | 1 refills | Status: DC

## 2016-09-30 NOTE — Progress Notes (Signed)
Subjective:  Patient ID: Teresa Riddle, female    DOB: 1986/01/23  Age: 31 y.o. MRN: [id number]  CC: Gastroesophageal Reflux (has come back); Anxiety (not taking meds); needs birth control; nutritionist (saw nutritionist was not happy with her); and bump right arm   HPI Teresa Riddle is a 31 year old female with a h/o GERD, Obesity, Anxiety here for a follow up on Anxiety and has not been taking her Buspar as symptoms have been controlled.  She endorses one episode where her throat seemed to closing up on her otherwise she has been good. Denies Depression and states ever since she started doing more for herself she has felt better.  She is changing her eating and has lost 7 lbs in the last one month but is yet to incorporate exercise into her regimen. Complains of having to clear her throat often and post nasal drip, no sinus tenderness. Noticed a right underarm bump which is not increasing in size, has been present for months and is sometimes itchy on her arm. Would like to get on OCP.  Past Medical History:  Diagnosis Date  . Anxiety     Past Surgical History:  Procedure Laterality Date  . DILATION AND CURETTAGE OF UTERUS       Outpatient Medications Prior to Visit  Medication Sig Dispense Refill  . ibuprofen (ADVIL,MOTRIN) 600 MG tablet Take 1 tablet (600 mg total) by mouth every 8 (eight) hours as needed for fever, headache, mild pain, moderate pain or cramping. 60 tablet 1  . omeprazole (PRILOSEC) 20 MG capsule Take 1 capsule (20 mg total) by mouth daily. 30 capsule 3  . ALPRAZolam (XANAX) 0.25 MG tablet Take 1 tablet (0.25 mg total) by mouth 3 (three) times daily as needed for anxiety. (Patient not taking: Reported on 09/30/2016) 12 tablet 0  . busPIRone (BUSPAR) 7.5 MG tablet Take 1 tablet (7.5 mg total) by mouth 2 (two) times daily. (Patient not taking: Reported on 09/30/2016) 60 tablet 3  . traZODone (DESYREL) 50 MG tablet Take 1 tablet (50 mg total) by mouth at  bedtime. (Patient not taking: Reported on 09/16/2016) 30 tablet 3   No facility-administered medications prior to visit.     ROS Review of Systems  Constitutional: Negative for activity change, appetite change and fatigue.  HENT: Positive for postnasal drip. Negative for congestion, sinus pressure and sore throat.   Eyes: Negative for visual disturbance.  Respiratory: Negative for cough, chest tightness, shortness of breath and wheezing.   Cardiovascular: Negative for chest pain and palpitations.  Gastrointestinal: Negative for abdominal distention, abdominal pain and constipation.  Endocrine: Negative for polydipsia.  Genitourinary: Negative for dysuria and frequency.  Musculoskeletal: Negative for arthralgias and back pain.  Skin: Positive for rash.  Neurological: Negative for tremors, light-headedness and numbness.  Hematological: Does not bruise/bleed easily.  Psychiatric/Behavioral: Negative for agitation and behavioral problems.    Objective:  BP 112/71 (BP Location: Right Arm, Patient Position: Sitting, Cuff Size: Large)   Pulse 84   Temp 98.2 F (36.8 C) (Oral)   Ht 5\' 4"  (1.626 m)   Wt 236 lb 6.4 oz (107.2 kg)   SpO2 99%   BMI 40.58 kg/m   BP/Weight 09/30/2016 09/16/2016 0000000  Systolic BP [id number] - 123XX123  Diastolic BP 71 - 71  Wt. (Lbs) 236.4 236.8 243.2  BMI 40.58 40.65 41.75      Physical Exam  Constitutional: She is oriented to person, place, and time. She appears well-developed and well-nourished.  Cardiovascular:  Normal rate, normal heart sounds and intact distal pulses.   No murmur heard. Pulmonary/Chest: Effort normal and breath sounds normal. She has no wheezes. She has no rales. She exhibits no tenderness.  Abdominal: Soft. Bowel sounds are normal. She exhibits no distension and no mass. There is no tenderness.  Musculoskeletal: Normal range of motion.  Neurological: She is alert and oriented to person, place, and time.  Skin:  Non tender right under  arm nodule     Assessment & Plan:   1. Generalized anxiety disorder Controlled Patient is currently using coping mechanisms  2. Gastroesophageal reflux disease without esophagitis controlled  3. Morbid obesity ([place]) Commended on weight loss Reduce portion sizes, increase physical activity  4. Chronic seasonal allergic rhinitis due to other allergen Placed on Zyrtec  5. Oral contraceptive use placed on Sprintec  We will monitor rash and patient to notify the clinic in the event of change in appearance of size.   Meds ordered this encounter  Medications  . cetirizine (ZYRTEC) 10 MG tablet    Sig: Take 1 tablet (10 mg total) by mouth daily.    Dispense:  30 tablet    Refill:  1  . norgestimate-ethinyl estradiol (SPRINTEC 28) 0.25-35 MG-MCG tablet    Sig: Take 1 tablet by mouth daily.    Dispense:  1 Package    Refill:  6    Follow-up: Return in about 3 months (around 12/28/2016) for Follow-up on anxiety and obesity.   Arnoldo Morale MD

## 2016-10-07 ENCOUNTER — Telehealth: Payer: Self-pay | Admitting: Family Medicine

## 2016-10-07 MED FILL — MONO-LINYAH 28 TABLET: 0.25-35 | 28 days supply | Qty: 28 | Fill #0

## 2016-10-07 MED FILL — ?CETIRIZINE HCL 10 MG TABLE: 10 | 30 days supply | Qty: 30 | Fill #0

## 2016-10-07 NOTE — Telephone Encounter (Signed)
error 

## 2016-10-09 ENCOUNTER — Ambulatory Visit: Payer: Medicaid Other | Attending: Family Medicine | Admitting: *Deleted

## 2016-10-09 DIAGNOSIS — Z1159 Encounter for screening for other viral diseases: Secondary | ICD-10-CM | POA: Diagnosis not present

## 2016-10-09 DIAGNOSIS — Z111 Encounter for screening for respiratory tuberculosis: Secondary | ICD-10-CM | POA: Diagnosis not present

## 2016-10-09 NOTE — Progress Notes (Signed)
   PPD Placement note  Teresa Riddle, 31 y.o. female is here today for placement of PPD test Reason for PPD test: employment Pt taken PPD test before: no Verified in allergy area and with patient that they are not allergic to the products PPD is made of (Phenol or Tween).  Yes Is patient taking any oral or IV steroid medication now or have they taken it in the last month? No Has the patient ever received the BCG vaccine?: No Has the patient been in recent contact with anyone known or suspected of having active TB disease?: No      PPD placed on 10/09/2016.  Patient advised to return for reading within 48 hours. Office is closed on Saturday. Pt given reminder card. Pt verbalized understanding. Carilyn Goodpasture, RN

## 2016-10-11 DIAGNOSIS — Z23 Encounter for immunization: Secondary | ICD-10-CM | POA: Diagnosis not present

## 2016-10-11 LAB — TB SKIN TEST
Induration: 0 mm
TB Skin Test: NEGATIVE

## 2016-10-11 LAB — HEPATITIS B SURFACE ANTIBODY, QUANTITATIVE

## 2016-10-14 ENCOUNTER — Ambulatory Visit: Payer: Medicaid Other | Admitting: Registered"

## 2016-10-14 LAB — MEASLES/MUMPS/RUBELLA IMMUNITY
Mumps IgG: 34.3 AU/mL — ABNORMAL HIGH (ref <9.00)
Rubella: 2.43 Index — ABNORMAL HIGH (ref <0.90)

## 2016-10-23 ENCOUNTER — Telehealth: Payer: Self-pay | Admitting: Family Medicine

## 2016-10-23 NOTE — Telephone Encounter (Signed)
Informed patient that documents were handed back to her. She needs the titer information completed/ signed by PCP.

## 2016-10-23 NOTE — Telephone Encounter (Signed)
Patient called the office asking to speak with nurse regarding her tB test documents. Pt stated that she needs the documents that were suppose to be filled out by PCP in order to start her job. Please follow up.  Thank you.

## 2016-10-23 NOTE — Telephone Encounter (Signed)
Could you please have patient bring in the form so I can sign off on it once she has received her hepatitis B. We have results of her titers.

## 2016-10-24 NOTE — Telephone Encounter (Signed)
Pt arrived to [facility] to have form signed by MD. Pt left with physical form and titer information in hand. Informed that she would need Hep B vaccine.pt verbalized understanding.

## 2016-11-27 ENCOUNTER — Encounter: Payer: Self-pay | Admitting: Family Medicine

## 2016-11-27 ENCOUNTER — Ambulatory Visit: Payer: Medicaid Other | Attending: Family Medicine | Admitting: Family Medicine

## 2016-11-27 VITALS — BP 113/75 | HR 74 | Temp 97.9°F | Ht 64.0 in | Wt 236.2 lb

## 2016-11-27 DIAGNOSIS — M25561 Pain in right knee: Secondary | ICD-10-CM | POA: Diagnosis present

## 2016-11-27 DIAGNOSIS — M25562 Pain in left knee: Secondary | ICD-10-CM | POA: Diagnosis not present

## 2016-11-27 DIAGNOSIS — Z9889 Other specified postprocedural states: Secondary | ICD-10-CM | POA: Diagnosis not present

## 2016-11-27 DIAGNOSIS — Z6841 Body Mass Index (BMI) 40.0 and over, adult: Secondary | ICD-10-CM | POA: Insufficient documentation

## 2016-11-27 DIAGNOSIS — K219 Gastro-esophageal reflux disease without esophagitis: Secondary | ICD-10-CM | POA: Diagnosis not present

## 2016-11-27 MED ORDER — MELOXICAM 7.5 MG PO TABS: 7.5000 mg | ORAL_TABLET | Freq: Every day | ORAL | 2 refills | Status: DC

## 2016-11-27 NOTE — Patient Instructions (Signed)

## 2016-11-27 NOTE — Progress Notes (Signed)
Subjective:  Patient ID: Teresa Riddle, female    DOB: 1985/08/31  Age: 31 y.o. MRN: [id number]  CC: Knee Pain   HPI Teresa Riddle is a 31 year old female with a h/o GERD, Obesity, AnxietyWho presents today with complains of bilateral knee pain ever since she started working out one month ago. Pain is worse with repetitive rising and squatting motions and is located in her anterior knee joint at the kneecap. Denies swelling of the knees. Taking ibuprofen with not much relief in symptoms.  She has been working on cutting back on calories and working out and her weight is stable at 236 pounds.  Past Medical History:  Diagnosis Date  . Anxiety     Past Surgical History:  Procedure Laterality Date  . DILATION AND CURETTAGE OF UTERUS      No Known Allergies   Outpatient Medications Prior to Visit  Medication Sig Dispense Refill  . cetirizine (ZYRTEC) 10 MG tablet Take 1 tablet (10 mg total) by mouth daily. 30 tablet 1  . norgestimate-ethinyl estradiol (SPRINTEC 28) 0.25-35 MG-MCG tablet Take 1 tablet by mouth daily. 1 Package 6  . omeprazole (PRILOSEC) 20 MG capsule Take 1 capsule (20 mg total) by mouth daily. 30 capsule 3  . ibuprofen (ADVIL,MOTRIN) 600 MG tablet Take 1 tablet (600 mg total) by mouth every 8 (eight) hours as needed for fever, headache, mild pain, moderate pain or cramping. 60 tablet 1   No facility-administered medications prior to visit.     ROS Review of Systems  Constitutional: Negative for activity change and appetite change.  HENT: Negative for sinus pressure and sore throat.   Respiratory: Negative for chest tightness, shortness of breath and wheezing.   Cardiovascular: Negative for chest pain and palpitations.  Gastrointestinal: Negative for abdominal distention, abdominal pain and constipation.  Genitourinary: Negative.   Musculoskeletal: Negative.   Psychiatric/Behavioral: Negative for behavioral problems and dysphoric mood.    Objective:   BP 113/75   Pulse 74   Temp 97.9 F (36.6 C) (Oral)   Ht 5\' 4"  (1.626 m)   Wt 236 lb 3.2 oz (107.1 kg)   SpO2 100%   BMI 40.54 kg/m   BP/Weight 11/27/2016 09/30/2016 3/38/2505  Systolic BP 397 673 -  Diastolic BP 75 71 -  Wt. (Lbs) 236.2 236.4 236.8  BMI 40.54 40.58 40.65      Physical Exam  Constitutional: She is oriented to person, place, and time. She appears well-developed and well-nourished.  Cardiovascular: Normal rate, normal heart sounds and intact distal pulses.   No murmur heard. Pulmonary/Chest: Effort normal and breath sounds normal. She has no wheezes. She has no rales. She exhibits no tenderness.  Abdominal: Soft. Bowel sounds are normal. She exhibits no distension and no mass. There is no tenderness.  Musculoskeletal: She exhibits tenderness (tenderness in anterior joint line on internal rotation of both knee joints). She exhibits no edema.  Negative anterior drawer sign  Neurological: She is alert and oriented to person, place, and time.     Assessment & Plan:   1. Acute pain of both knees Advised to apply ice Use knee brace as needed - meloxicam (MOBIC) 7.5 MG tablet; Take 1 tablet (7.5 mg total) by mouth daily.  Dispense: 30 tablet; Refill: 2  2. Morbid obesity ([place]) Encouraged to continue with exercise with the goal of weight loss Cut back on caloric intake.   Meds ordered this encounter  Medications  . meloxicam (MOBIC) 7.5 MG tablet  Sig: Take 1 tablet (7.5 mg total) by mouth daily.    Dispense:  30 tablet    Refill:  2    Follow-up: Return in about 3 months (around 02/26/2017) for follow up on knee pain and weight.   Arnoldo Morale MD

## 2016-12-17 MED FILL — MELOXICAM 7.5 MG TABLET: 7.5 | 30 days supply | Qty: 30 | Fill #0

## 2016-12-23 ENCOUNTER — Encounter: Payer: Self-pay | Admitting: Family Medicine

## 2016-12-23 ENCOUNTER — Ambulatory Visit: Payer: Medicaid Other | Attending: Family Medicine | Admitting: Family Medicine

## 2016-12-23 VITALS — BP 104/63 | HR 73 | Temp 98.5°F | Resp 18 | Ht 64.0 in | Wt 233.0 lb

## 2016-12-23 DIAGNOSIS — F411 Generalized anxiety disorder: Secondary | ICD-10-CM | POA: Diagnosis not present

## 2016-12-23 DIAGNOSIS — M25561 Pain in right knee: Secondary | ICD-10-CM | POA: Diagnosis not present

## 2016-12-23 DIAGNOSIS — G8929 Other chronic pain: Secondary | ICD-10-CM | POA: Diagnosis not present

## 2016-12-23 DIAGNOSIS — M25562 Pain in left knee: Secondary | ICD-10-CM | POA: Insufficient documentation

## 2016-12-23 DIAGNOSIS — Z79899 Other long term (current) drug therapy: Secondary | ICD-10-CM | POA: Insufficient documentation

## 2016-12-23 DIAGNOSIS — J0100 Acute maxillary sinusitis, unspecified: Secondary | ICD-10-CM | POA: Diagnosis present

## 2016-12-23 MED ORDER — AMOXICILLIN 500 MG PO CAPS: 500.0000 mg | ORAL_CAPSULE | Freq: Three times a day (TID) | ORAL | 0 refills | Status: DC

## 2016-12-23 NOTE — Progress Notes (Signed)
Subjective:  Patient ID: Teresa Riddle, female    DOB: 1986/07/26  Age: 31 y.o. MRN: [id number]  CC: Sinusitis   HPI Teresa Riddle ESA 15-year-old female with a history of generalized anxiety disorder who presents with 2-3 day history of cough productive of greenish sputum, postnasal drip, chest discomfort, sneezing and rhinorrhea. She takes Zyrtec regularly but this has been ineffective as her symptoms have been greater than her usual seasonal allergies. She denies sinus tenderness, fever but does have some myalgias.  She continues to have bilateral knee pain worse on sitting or rising from a sitting position and she uses meloxicam with some relief in symptoms She is continuing to exercise but her knee pain hinders her from doing so as often as she would like. She has lost 3 pounds since her last visit one month ago.  Past Medical History:  Diagnosis Date  . Anxiety     Past Surgical History:  Procedure Laterality Date  . DILATION AND CURETTAGE OF UTERUS      No Known Allergies   Outpatient Medications Prior to Visit  Medication Sig Dispense Refill  . meloxicam (MOBIC) 7.5 MG tablet Take 1 tablet (7.5 mg total) by mouth daily. 30 tablet 2  . omeprazole (PRILOSEC) 20 MG capsule Take 1 capsule (20 mg total) by mouth daily. 30 capsule 3  . cetirizine (ZYRTEC) 10 MG tablet Take 1 tablet (10 mg total) by mouth daily. (Patient not taking: Reported on 12/23/2016) 30 tablet 1  . norgestimate-ethinyl estradiol (SPRINTEC 28) 0.25-35 MG-MCG tablet Take 1 tablet by mouth daily. (Patient not taking: Reported on 12/23/2016) 1 Package 6   No facility-administered medications prior to visit.     ROS Review of Systems  Constitutional: Negative for activity change, appetite change and fatigue.  HENT:       See hpi  Eyes: Negative for visual disturbance.  Respiratory: Positive for cough. Negative for chest tightness, shortness of breath and wheezing.   Cardiovascular: Negative for  chest pain and palpitations.  Gastrointestinal: Negative for abdominal distention, abdominal pain and constipation.  Endocrine: Negative for polydipsia.  Genitourinary: Negative for dysuria and frequency.  Musculoskeletal: Negative for arthralgias and back pain.  Skin: Negative for rash.  Neurological: Negative for tremors, light-headedness and numbness.  Hematological: Does not bruise/bleed easily.  Psychiatric/Behavioral: Negative for agitation and behavioral problems.    Objective:  BP 104/63 (BP Location: Right Arm, Patient Position: Sitting, Cuff Size: Large)   Pulse 73   Temp 98.5 F (36.9 C) (Oral)   Resp 18   Ht 5\' 4"  (1.626 m)   Wt 233 lb (105.7 kg)   LMP 12/07/2016   SpO2 100%   BMI 39.99 kg/m   BP/Weight 12/23/2016 11/27/2016 2/40/9735  Systolic BP 329 924 268  Diastolic BP 63 75 71  Wt. (Lbs) 233 236.2 236.4  BMI 39.99 40.54 40.58      Physical Exam  Constitutional: She is oriented to person, place, and time. She appears well-developed and well-nourished.  HENT:  Right Ear: External ear normal.  Left Ear: External ear normal.  Postnasal drip in oropharynx  Cardiovascular: Normal rate, normal heart sounds and intact distal pulses.   No murmur heard. Pulmonary/Chest: Effort normal and breath sounds normal. She has no wheezes. She has no rales. She exhibits no tenderness.  Abdominal: Soft. Bowel sounds are normal. She exhibits no distension and no mass. There is no tenderness.  Musculoskeletal: Normal range of motion.  Neurological: She is alert and oriented  to person, place, and time.     Assessment & Plan:   1. Chronic pain of both knees Continue meloxicam Advised that weight loss will help  2. Acute non-recurrent maxillary sinusitis Placed on amoxicillin   Meds ordered this encounter  Medications  . amoxicillin (AMOXIL) 500 MG capsule    Sig: Take 1 capsule (500 mg total) by mouth 3 (three) times daily.    Dispense:  30 capsule    Refill:  0     Follow-up: Return in about 3 months (around 03/25/2017) for Follow-up of chronic medical conditions.   Arnoldo Morale MD

## 2016-12-23 NOTE — Progress Notes (Signed)
Patient is here for sinus concerns  Patient states on Friday she began sneezing. Over the weekend the symptoms increased with cough and drainage. Patient states today mucous is green with blood tinges and has chest discomfort from coughing all weekend.  Patient has not taken any medication today. Patient has not eaten today.  Patient complains of bilateral knee aches when bending. Patient has continued with workouts. Now at 3 weeks.

## 2016-12-31 ENCOUNTER — Ambulatory Visit: Payer: Self-pay | Admitting: Family Medicine

## 2017-01-17 ENCOUNTER — Ambulatory Visit: Payer: Medicaid Other | Attending: Family Medicine | Admitting: Family Medicine

## 2017-01-17 ENCOUNTER — Encounter: Payer: Self-pay | Admitting: Family Medicine

## 2017-01-17 ENCOUNTER — Ambulatory Visit (HOSPITAL_COMMUNITY)
Admission: RE | Admit: 2017-01-17 | Discharge: 2017-01-17 | Disposition: A | Discharge status: Home / Self Care | Payer: Medicaid Other | Source: Ambulatory Visit | Attending: Family Medicine | Admitting: Family Medicine

## 2017-01-17 VITALS — BP 119/72 | HR 62 | Temp 97.9°F | Resp 18 | Ht 64.0 in | Wt 242.0 lb

## 2017-01-17 DIAGNOSIS — M25561 Pain in right knee: Secondary | ICD-10-CM | POA: Insufficient documentation

## 2017-01-17 DIAGNOSIS — M25562 Pain in left knee: Secondary | ICD-10-CM | POA: Insufficient documentation

## 2017-01-17 DIAGNOSIS — Z79899 Other long term (current) drug therapy: Secondary | ICD-10-CM | POA: Insufficient documentation

## 2017-01-17 DIAGNOSIS — R600 Localized edema: Secondary | ICD-10-CM | POA: Diagnosis not present

## 2017-01-17 DIAGNOSIS — F419 Anxiety disorder, unspecified: Secondary | ICD-10-CM | POA: Insufficient documentation

## 2017-01-17 DIAGNOSIS — G8929 Other chronic pain: Secondary | ICD-10-CM | POA: Insufficient documentation

## 2017-01-17 DIAGNOSIS — F411 Generalized anxiety disorder: Secondary | ICD-10-CM | POA: Diagnosis not present

## 2017-01-17 LAB — POCT URINE PREGNANCY: PREG TEST UR: NEGATIVE

## 2017-01-17 MED ORDER — TRAMADOL HCL 50 MG PO TABS: 50.0000 mg | ORAL_TABLET | Freq: Two times a day (BID) | ORAL | 1 refills | Status: DC | PRN

## 2017-01-17 NOTE — Progress Notes (Signed)
Patient is here for bilateral knee pain  Patient complains of bilateral knee pain being present and scaled at a 10.  Patient has not taken medication today. Patient has eaten today.

## 2017-01-17 NOTE — Patient Instructions (Signed)

## 2017-01-17 NOTE — Progress Notes (Signed)
Subjective:  Patient ID: Teresa Riddle, female    DOB: [birth year]  Age: 31 y.o. MRN: [id number]  CC: Knee Pain (bilateral)   HPI Teresa Riddle is a 31 year old female with a history of generalized anxiety disorder who presents today complaining of bilateral knee pain which she has had for the last 2 months ever since she started working out in an attempt to lose weight. She has been on meloxicam which she states makes the pain bearable. Pain is unrelieved with ice packs and is rated as an 8/10 at this time but gets to 10/10 as its worst. It is absent at rest but increased when she attempts to squat or stand up from a sitting position.  She has also noticed slight swelling of both ankles but denies. Ankles.  Past Medical History:  Diagnosis Date  . Anxiety     Past Surgical History:  Procedure Laterality Date  . DILATION AND CURETTAGE OF UTERUS      No Known Allergies   Outpatient Medications Prior to Visit  Medication Sig Dispense Refill  . cetirizine (ZYRTEC) 10 MG tablet Take 1 tablet (10 mg total) by mouth daily. 30 tablet 1  . meloxicam (MOBIC) 7.5 MG tablet Take 1 tablet (7.5 mg total) by mouth daily. 30 tablet 2  . norgestimate-ethinyl estradiol (SPRINTEC 28) 0.25-35 MG-MCG tablet Take 1 tablet by mouth daily. (Patient not taking: Reported on 12/23/2016) 1 Package 6  . omeprazole (PRILOSEC) 20 MG capsule Take 1 capsule (20 mg total) by mouth daily. (Patient not taking: Reported on 01/17/2017) 30 capsule 3  . amoxicillin (AMOXIL) 500 MG capsule Take 1 capsule (500 mg total) by mouth 3 (three) times daily. 30 capsule 0   No facility-administered medications prior to visit.     ROS Review of Systems  Constitutional: Negative for activity change, appetite change and fatigue.  HENT: Negative for congestion, sinus pressure and sore throat.   Eyes: Negative for visual disturbance.  Respiratory: Negative for cough, chest tightness, shortness of breath and wheezing.     Cardiovascular: Positive for leg swelling. Negative for chest pain and palpitations.  Gastrointestinal: Negative for abdominal distention, abdominal pain and constipation.  Endocrine: Negative for polydipsia.  Genitourinary: Negative for dysuria and frequency.  Musculoskeletal:       See hpi  Skin: Negative for rash.  Neurological: Negative for tremors, light-headedness and numbness.  Hematological: Does not bruise/bleed easily.  Psychiatric/Behavioral: Negative for agitation and behavioral problems.    Objective:  BP 119/72 (BP Location: Left Arm, Patient Position: Sitting, Cuff Size: Large)   Pulse 62   Temp 97.9 F (36.6 C) (Oral)   Resp 18   Ht 5\' 4"  (1.626 m)   Wt 242 lb (109.8 kg)   LMP 01/13/2017   SpO2 100%   BMI 41.54 kg/m   BP/Weight 01/17/2017 12/23/2016 4/46/2863  Systolic BP 817 711 657  Diastolic BP 72 63 75  Wt. (Lbs) 242 233 236.2  BMI 41.54 39.99 40.54      Physical Exam Constitutional: She is oriented to person, place, and time. She appears well-developed and well-nourished.  Cardiovascular: Normal rate, normal heart sounds and intact distal pulses.   No murmur heard. 1+ bilateral pitting pedal edema Pulmonary/Chest: Effort normal and breath sounds normal. She has no wheezes. She has no rales. She exhibits no tenderness.  Abdominal: Soft. Bowel sounds are normal. She exhibits no distension and no mass. There is no tenderness.  Musculoskeletal: Normal range of motion.  no tenderness  on palpation or range of motion  Neurological: She is alert and oriented to person, place, and time.  Psych: Normal   Assessment & Plan:   1. Chronic pain of both knees Uncontrolled on meloxicam Tramadol added to regimen If x-rays are negative we'll proceed with MRI May need orthopedic referral down the road if persisting Advised that weight loss will alleviate symptoms - DG Knee Complete 4 Views Left; Future - DG Knee Complete 4 Views Right; Future  2. Pedal  edema Could be positional Elevate feet, reduce sodium intake  3. Morbid obesity ([place]) She is working on weight loss Continue exercise, reducing portion sizes   Meds ordered this encounter  Medications  . traMADol (ULTRAM) 50 MG tablet    Sig: Take 1 tablet (50 mg total) by mouth every 12 (twelve) hours as needed.    Dispense:  40 tablet    Refill:  1    Follow-up: Return if symptoms worsen or fail to improve, for Follow-up of bilateral knee pain, please keep previously scheduled appointment.   Arnoldo Morale MD

## 2017-01-23 ENCOUNTER — Other Ambulatory Visit: Payer: Self-pay | Admitting: Family Medicine

## 2017-01-23 DIAGNOSIS — M25561 Pain in right knee: Principal | ICD-10-CM

## 2017-01-23 DIAGNOSIS — M25562 Pain in left knee: Principal | ICD-10-CM

## 2017-01-23 DIAGNOSIS — G8929 Other chronic pain: Secondary | ICD-10-CM

## 2017-01-23 NOTE — Progress Notes (Signed)
Could you please schedule a bilateral knee MRI for this patient? Thank you.

## 2017-01-24 ENCOUNTER — Telehealth: Payer: Self-pay | Admitting: *Deleted

## 2017-01-24 NOTE — Telephone Encounter (Signed)
Pt aware that MRI appointment scheduled for 02/03/17 at 4p

## 2017-02-03 ENCOUNTER — Ambulatory Visit (HOSPITAL_COMMUNITY)
Admission: RE | Admit: 2017-02-03 | Discharge: 2017-02-03 | Disposition: A | Discharge status: Home / Self Care | Payer: Medicaid Other | Source: Ambulatory Visit | Attending: Family Medicine | Admitting: Family Medicine

## 2017-02-03 DIAGNOSIS — M25562 Pain in left knee: Principal | ICD-10-CM

## 2017-02-03 DIAGNOSIS — M25561 Pain in right knee: Principal | ICD-10-CM

## 2017-02-03 DIAGNOSIS — R600 Localized edema: Secondary | ICD-10-CM | POA: Diagnosis not present

## 2017-02-03 DIAGNOSIS — G8929 Other chronic pain: Secondary | ICD-10-CM | POA: Diagnosis present

## 2017-02-06 ENCOUNTER — Telehealth: Payer: Self-pay | Admitting: Family Medicine

## 2017-02-06 NOTE — Telephone Encounter (Signed)
PT called she saw the MRI result on her Chart, she is wondering if you need to see her before her appt on 02/25/17, please call her back, please follow up

## 2017-02-07 NOTE — Telephone Encounter (Signed)
Based on MRI results patient would like to know if she should be seen before 02/25/17. Please advise.

## 2017-02-07 NOTE — Telephone Encounter (Signed)
She should still keep her current appointment.

## 2017-02-25 ENCOUNTER — Ambulatory Visit: Payer: Medicaid Other | Attending: Family Medicine | Admitting: Family Medicine

## 2017-02-25 ENCOUNTER — Encounter: Payer: Self-pay | Admitting: Family Medicine

## 2017-02-25 VITALS — BP 116/75 | HR 82 | Temp 98.4°F | Resp 18 | Ht 64.0 in | Wt 235.4 lb

## 2017-02-25 DIAGNOSIS — M25562 Pain in left knee: Secondary | ICD-10-CM | POA: Insufficient documentation

## 2017-02-25 DIAGNOSIS — R519 Headache, unspecified: Secondary | ICD-10-CM

## 2017-02-25 DIAGNOSIS — Z79899 Other long term (current) drug therapy: Secondary | ICD-10-CM | POA: Insufficient documentation

## 2017-02-25 DIAGNOSIS — Z9889 Other specified postprocedural states: Secondary | ICD-10-CM | POA: Diagnosis not present

## 2017-02-25 DIAGNOSIS — M25561 Pain in right knee: Secondary | ICD-10-CM | POA: Insufficient documentation

## 2017-02-25 DIAGNOSIS — R634 Abnormal weight loss: Secondary | ICD-10-CM | POA: Insufficient documentation

## 2017-02-25 DIAGNOSIS — F411 Generalized anxiety disorder: Secondary | ICD-10-CM | POA: Insufficient documentation

## 2017-02-25 DIAGNOSIS — R51 Headache: Secondary | ICD-10-CM | POA: Insufficient documentation

## 2017-02-25 DIAGNOSIS — F419 Anxiety disorder, unspecified: Secondary | ICD-10-CM | POA: Diagnosis not present

## 2017-02-25 DIAGNOSIS — G8929 Other chronic pain: Secondary | ICD-10-CM | POA: Diagnosis not present

## 2017-02-25 MED ORDER — CETIRIZINE HCL 10 MG PO TABS: 10.0000 mg | ORAL_TABLET | Freq: Every day | ORAL | 1 refills | Status: DC

## 2017-02-25 MED ORDER — NALTREXONE-BUPROPION HCL ER 8-90 MG PO TB12: 1.0000 | ORAL_TABLET | Freq: Two times a day (BID) | ORAL | 2 refills | Status: DC

## 2017-02-25 MED ORDER — IBUPROFEN 600 MG PO TABS: 600.0000 mg | ORAL_TABLET | Freq: Three times a day (TID) | ORAL | 1 refills | Status: DC | PRN

## 2017-02-25 NOTE — Progress Notes (Signed)
Patient has not eaten Patient has not had any medication

## 2017-02-25 NOTE — Patient Instructions (Signed)

## 2017-02-25 NOTE — Progress Notes (Signed)
Subjective:  Patient ID: Teresa Riddle, female    DOB: 1985-12-10  Age: 31 y.o. MRN: [id number]  CC: Knee Pain   HPI Teresa Riddle is a 31 year old female with a history of generalized anxiety disorder who presents today for follow-up of bilateral knee pain which she has had for the last couple of months ever since she started working out in an attempt to lose weight.  MRI of both knees revealed prepatellar subcutaneous soft tissue edema, nonspecific in etiology, no bony abnormality, intact ligaments, no miniscule tear.  Knee pain has improved ever since she stopped working out and she just feels stiffness in both knees. She will need a prescription for ibuprofen which she states relieves the pain better than meloxicam.  Her attempts at weight loss have not been too fruitful and she is wanting to go on medications to help with this.  She endorses intermittent headaches about twice a week, waking up with puffy eyelids but denies postnasal drip, fever or rhinorrhea. Denies nausea, vomiting.  Past Medical History:  Diagnosis Date  . Anxiety     Past Surgical History:  Procedure Laterality Date  . DILATION AND CURETTAGE OF UTERUS      No Known Allergies   Outpatient Medications Prior to Visit  Medication Sig Dispense Refill  . traMADol (ULTRAM) 50 MG tablet Take 1 tablet (50 mg total) by mouth every 12 (twelve) hours as needed. 40 tablet 1  . norgestimate-ethinyl estradiol (SPRINTEC 28) 0.25-35 MG-MCG tablet Take 1 tablet by mouth daily. (Patient not taking: Reported on 12/23/2016) 1 Package 6  . omeprazole (PRILOSEC) 20 MG capsule Take 1 capsule (20 mg total) by mouth daily. (Patient not taking: Reported on 01/17/2017) 30 capsule 3  . cetirizine (ZYRTEC) 10 MG tablet Take 1 tablet (10 mg total) by mouth daily. (Patient not taking: Reported on 02/25/2017) 30 tablet 1  . meloxicam (MOBIC) 7.5 MG tablet Take 1 tablet (7.5 mg total) by mouth daily. (Patient not taking: Reported on  02/25/2017) 30 tablet 2   No facility-administered medications prior to visit.     ROS Review of Systems  Constitutional: Negative for activity change, appetite change and fatigue.  HENT: Negative for congestion, sinus pressure and sore throat.   Eyes: Negative for visual disturbance.  Respiratory: Negative for cough, chest tightness, shortness of breath and wheezing.   Cardiovascular: Negative for chest pain and palpitations.  Gastrointestinal: Negative for abdominal distention, abdominal pain and constipation.  Endocrine: Negative for polydipsia.  Genitourinary: Negative for dysuria and frequency.  Musculoskeletal:       See hpi  Skin: Negative for rash.  Neurological: Negative for tremors, light-headedness and numbness.  Hematological: Does not bruise/bleed easily.  Psychiatric/Behavioral: Negative for agitation and behavioral problems.    Objective:  BP 116/75 (BP Location: Left Arm, Patient Position: Sitting, Cuff Size: Large)   Pulse 82   Temp 98.4 F (36.9 C) (Oral)   Resp 18   Ht 5\' 4"  (1.626 m)   Wt 235 lb 6.4 oz (106.8 kg)   LMP 02/24/2017 (Exact Date)   SpO2 100%   BMI 40.41 kg/m   BP/Weight 02/25/2017 01/17/2017 0/76/2263  Systolic BP 335 456 256  Diastolic BP 75 72 63  Wt. (Lbs) 235.4 242 233  BMI 40.41 41.54 39.99      Physical Exam  Constitutional: She is oriented to person, place, and time. She appears well-developed and well-nourished.  Cardiovascular: Normal rate, normal heart sounds and intact distal pulses.   No  murmur heard. Pulmonary/Chest: Effort normal and breath sounds normal. She has no wheezes. She has no rales. She exhibits no tenderness.  Abdominal: Soft. Bowel sounds are normal. She exhibits no distension and no mass. There is no tenderness.  Musculoskeletal: Normal range of motion.  Neurological: She is alert and oriented to person, place, and time.     Assessment & Plan:   1. Chronic pain of both knees Improved She does have  some residual stiffness Switch from meloxicam to ibuprofen she states the latter is better Advised that weight loss will help with symptoms - ibuprofen (ADVIL,MOTRIN) 600 MG tablet; Take 1 tablet (600 mg total) by mouth every 8 (eight) hours as needed.  Dispense: 60 tablet; Refill: 1  2. Morbid obesity ([facility]) Continue exercise as knee pain tolerates Reduce portion sizes We have discussed side effects of medication - Naltrexone-Bupropion HCl ER 8-90 MG TB12; Take 1 tablet by mouth 2 (two) times daily. Start with1 tablet daily in the morning then 1 tab twice daily  Dispense: 60 tablet; Refill: 2  3. Sinus headache Placed on Zyrtec Tylenol when necessary headaches If symptoms persist will consider Topamax.   Meds ordered this encounter  Medications  . cetirizine (ZYRTEC) 10 MG tablet    Sig: Take 1 tablet (10 mg total) by mouth daily.    Dispense:  30 tablet    Refill:  1  . ibuprofen (ADVIL,MOTRIN) 600 MG tablet    Sig: Take 1 tablet (600 mg total) by mouth every 8 (eight) hours as needed.    Dispense:  60 tablet    Refill:  1  . Naltrexone-Bupropion HCl ER 8-90 MG TB12    Sig: Take 1 tablet by mouth 2 (two) times daily. Start with1 tablet daily in the morning then 1 tab twice daily    Dispense:  60 tablet    Refill:  2    Follow-up: Return in about 3 months (around 05/28/2017) for Follow-up on knee pain and obesity.   This note has been created with Surveyor, quantity. Any transcriptional errors are unintentional.     Arnoldo Morale MD

## 2017-03-11 ENCOUNTER — Ambulatory Visit: Payer: Medicaid Other | Attending: Internal Medicine | Admitting: Physician Assistant

## 2017-03-11 VITALS — BP 109/72 | HR 91 | Temp 98.4°F | Resp 18 | Ht 64.0 in | Wt 234.0 lb

## 2017-03-11 DIAGNOSIS — R51 Headache: Secondary | ICD-10-CM | POA: Insufficient documentation

## 2017-03-11 DIAGNOSIS — R110 Nausea: Secondary | ICD-10-CM

## 2017-03-11 DIAGNOSIS — L03811 Cellulitis of head [any part, except face]: Secondary | ICD-10-CM | POA: Diagnosis not present

## 2017-03-11 DIAGNOSIS — F419 Anxiety disorder, unspecified: Secondary | ICD-10-CM | POA: Diagnosis not present

## 2017-03-11 MED ORDER — ONDANSETRON HCL 8 MG PO TABS: 8.0000 mg | ORAL_TABLET | Freq: Three times a day (TID) | ORAL | 0 refills | Status: DC | PRN

## 2017-03-11 MED ORDER — MUPIROCIN 2 % EX OINT
TOPICAL_OINTMENT | CUTANEOUS | 0 refills | Status: DC
Start: 2017-03-11 — End: 2017-08-01

## 2017-03-11 MED ORDER — DOXYCYCLINE HYCLATE 100 MG PO TABS: 100.0000 mg | ORAL_TABLET | Freq: Two times a day (BID) | ORAL | 0 refills | Status: DC

## 2017-03-11 NOTE — Progress Notes (Signed)
Patient ID: Teresa Riddle, female   DOB: 15-Jul-1986, 31 y.o.   MRN: [id number]   Teresa Riddle, is a 31 y.o. female  XTG:[id number]  EVO:[id number]  DOB - 1985/10/02  Subjective:  Chief Complaint and HPI: Teresa Riddle is a 31 y.o. female here today with a tender spot on her scalp for about 1 week that is getting progressively worse.  No f/c.  She has felt some nausea without vomiting.  The area is very tender when she tries to wash her hair and she is having headaches that are relieved with ibuprofen.    ROS:   Constitutional:  No f/c, No night sweats, No unexplained weight loss. EENT:  No vision changes, No blurry vision, No hearing changes. No mouth, throat, or ear problems.  Respiratory: No cough, No SOB Cardiac: No CP, no palpitations GI:  No abd pain, No V/D, +mild nausea. GU: No Urinary s/sx Musculoskeletal: No joint pain Neuro: + headache, no dizziness, no motor weakness.  Skin: No rash Endocrine:  No polydipsia. No polyuria.  Psych: Denies SI/HI  No problems updated.  ALLERGIES: No Known Allergies  PAST MEDICAL HISTORY: Past Medical History:  Diagnosis Date  . Anxiety     MEDICATIONS AT HOME: Prior to Admission medications   Medication Sig Start Date End Date Taking? Authorizing Provider  cetirizine (ZYRTEC) 10 MG tablet Take 1 tablet (10 mg total) by mouth daily. 02/25/17  Yes Arnoldo Morale, MD  ibuprofen (ADVIL,MOTRIN) 600 MG tablet Take 1 tablet (600 mg total) by mouth every 8 (eight) hours as needed. 02/25/17  Yes Arnoldo Morale, MD  Naltrexone-Bupropion HCl ER 8-90 MG TB12 Take 1 tablet by mouth 2 (two) times daily. Start with1 tablet daily in the morning then 1 tab twice daily 02/25/17  Yes Amao, Charlane Ferretti, MD  doxycycline (VIBRA-TABS) 100 MG tablet Take 1 tablet (100 mg total) by mouth 2 (two) times daily. 03/11/17   Argentina Donovan, PA-C  mupirocin ointment (BACTROBAN) 2 % Apply bid to AA x 7days 03/11/17   Argentina Donovan, PA-C  norgestimate-ethinyl  estradiol (SPRINTEC 28) 0.25-35 MG-MCG tablet Take 1 tablet by mouth daily. Patient not taking: Reported on 12/23/2016 09/30/16   Arnoldo Morale, MD  omeprazole (PRILOSEC) 20 MG capsule Take 1 capsule (20 mg total) by mouth daily. Patient not taking: Reported on 01/17/2017 07/19/16   Arnoldo Morale, MD  ondansetron (ZOFRAN) 8 MG tablet Take 1 tablet (8 mg total) by mouth every 8 (eight) hours as needed for nausea or vomiting. 03/11/17   Argentina Donovan, PA-C  traMADol (ULTRAM) 50 MG tablet Take 1 tablet (50 mg total) by mouth every 12 (twelve) hours as needed. Patient not taking: Reported on 03/11/2017 01/17/17   Arnoldo Morale, MD     Objective:  EXAM:   Vitals:   03/11/17 1145  BP: 109/72  Pulse: 91  Resp: 18  Temp: 98.4 F (36.9 C)  TempSrc: Oral  SpO2: 97%  Weight: 234 lb (106.1 kg)  Height: 5\' 4"  (1.626 m)    General appearance : A&OX3. NAD. Non-toxic-appearing HEENT: Atraumatic and Normocephalic.  PERRLA. EOM intact.  L parietal scalp-there is a <1cm area that is tender and slightly firm and fluctuant without surrounding induration.  Gentle pressure expresses some purulent drainage.  Culture taken.  +mild occipital node response on L.   Neck: supple, no JVD. No cervical lymphadenopathy. No thyromegaly Chest/Lungs:  Breathing-non-labored, Good air entry bilaterally, breath sounds normal without rales, rhonchi, or wheezing  CVS: S1 S2 regular,  no murmurs, gallops, rubs  Extremities: Bilateral Lower Ext shows no edema, both legs are warm to touch with = pulse throughout Neurology:  CN II-XII grossly intact, Non focal.   Psych:  TP linear. J/I WNL. Normal speech. Appropriate eye contact and affect.  Skin:  No Rash  Data Review No results found for: HGBA1C   Assessment & Plan   1. Cellulitis of head except face Wound care discussed.  - WOUND CULTURE - doxycycline (VIBRA-TABS) 100 MG tablet; Take 1 tablet (100 mg total) by mouth 2 (two) times daily.  Dispense: 20 tablet; Refill:  0 - mupirocin ointment (BACTROBAN) 2 %; Apply bid to AA x 7days  Dispense: 22 g; Refill: 0  2. Nausea Secondary to #1 - ondansetron (ZOFRAN) 8 MG tablet; Take 1 tablet (8 mg total) by mouth every 8 (eight) hours as needed for nausea or vomiting.  Dispense: 20 tablet; Refill: 0  Patient have been counseled extensively about nutrition and exercise  Return if symptoms worsen or fail to improve.  The patient was given clear instructions to go to ER or return to medical center if symptoms don't improve, worsen or new problems develop. The patient verbalized understanding. The patient was told to call to get lab results if they haven't heard anything in the next week.     Freeman Caldron, PA-C [facility] [place], [place]   03/11/2017, 1:05 PM

## 2017-03-14 LAB — WOUND CULTURE: ORGANISM ID, BACTERIA: NONE SEEN

## 2017-03-19 ENCOUNTER — Telehealth: Payer: Self-pay | Admitting: *Deleted

## 2017-03-19 NOTE — Telephone Encounter (Signed)
-----   Message from Argentina Donovan, [place] sent at 03/17/2017  8:45 AM EDT ----- Your culture results did show a staph infection.  You are on the correct treatments and should finish all of your medications.. Follow-up if any problems. Thanks, Freeman Caldron, PA-C

## 2017-03-19 NOTE — Telephone Encounter (Signed)
Patient verified DOB Patient states she did not complete the Doxycycline due to severe mouth dryness with small bumps on her lips. Patient denied fever or anaphylaxis. Patient has 10 pills left. Patient states the site in her scalp has cleared. Patient will receive a FU call regarding an alternative if needed.

## 2017-03-20 ENCOUNTER — Telehealth: Payer: Self-pay | Admitting: *Deleted

## 2017-03-20 ENCOUNTER — Other Ambulatory Visit: Payer: Self-pay | Admitting: Physician Assistant

## 2017-03-20 MED ORDER — SULFAMETHOXAZOLE-TRIMETHOPRIM 800-160 MG PO TABS: 1.0000 | ORAL_TABLET | Freq: Two times a day (BID) | ORAL | 0 refills | Status: DC

## 2017-03-20 NOTE — Telephone Encounter (Signed)
-----   Message from Argentina Donovan, [place] sent at 03/20/2017  7:28 AM EDT ----- You can stop the doxycycline.  Is sent you a prescription for Septra instead.  Thanks, Freeman Caldron, PA-C

## 2017-03-20 NOTE — Telephone Encounter (Signed)
Patient verified DOB Patient is aware of alternative being sent to the [facility]. Patient expressed her understanding and had no further questions.

## 2017-05-30 ENCOUNTER — Ambulatory Visit: Payer: Medicaid Other | Attending: Family Medicine | Admitting: Family Medicine

## 2017-05-30 ENCOUNTER — Encounter: Payer: Self-pay | Admitting: Family Medicine

## 2017-05-30 VITALS — BP 110/74 | HR 69 | Temp 98.5°F | Ht 64.0 in | Wt 237.4 lb

## 2017-05-30 DIAGNOSIS — Z8669 Personal history of other diseases of the nervous system and sense organs: Secondary | ICD-10-CM | POA: Diagnosis not present

## 2017-05-30 DIAGNOSIS — R51 Headache: Secondary | ICD-10-CM | POA: Insufficient documentation

## 2017-05-30 DIAGNOSIS — K219 Gastro-esophageal reflux disease without esophagitis: Secondary | ICD-10-CM | POA: Diagnosis not present

## 2017-05-30 DIAGNOSIS — R519 Headache, unspecified: Secondary | ICD-10-CM

## 2017-05-30 MED ORDER — OMEPRAZOLE 20 MG PO CPDR: 20.0000 mg | DELAYED_RELEASE_CAPSULE | Freq: Every day | ORAL | 3 refills | Status: DC

## 2017-05-30 MED ORDER — TOPIRAMATE 50 MG PO TABS: 50.0000 mg | ORAL_TABLET | Freq: Two times a day (BID) | ORAL | 3 refills | Status: DC

## 2017-05-30 MED ORDER — CETIRIZINE HCL 10 MG PO TABS: 10.0000 mg | ORAL_TABLET | Freq: Every day | ORAL | 1 refills | Status: DC

## 2017-05-30 NOTE — Progress Notes (Signed)
Subjective:  Patient ID: Teresa Riddle, female    DOB: 1986/06/12  Age: 31 y.o. MRN: [id number]  CC: Gastroesophageal Reflux   HPI Teresa Riddle presents With a two-week history of left retro-orbital headaches which have been intermittent. Headache is described as moderate and is not associated with sinus pressure or pain and no upper respiratory symptoms. She gets 6-7 hours of sleep was denies excessive fatigue. She does have a history of migraines but this does not feel like one.  She complains of worsening reflux which feels like something stuck in her chest. Denies nausea, vomiting or abdominal pain. Her bowel movements are good.  Previously prescribed contrave for weight loss which was not approved by her insurance.  Past Medical History:  Diagnosis Date  . Anxiety     Past Surgical History:  Procedure Laterality Date  . DILATION AND CURETTAGE OF UTERUS      No Known Allergies   Outpatient Medications Prior to Visit  Medication Sig Dispense Refill  . ibuprofen (ADVIL,MOTRIN) 600 MG tablet Take 1 tablet (600 mg total) by mouth every 8 (eight) hours as needed. 60 tablet 1  . omeprazole (PRILOSEC) 20 MG capsule Take 1 capsule (20 mg total) by mouth daily. 30 capsule 3  . mupirocin ointment (BACTROBAN) 2 % Apply bid to AA x 7days (Patient not taking: Reported on 05/30/2017) 22 g 0  . cetirizine (ZYRTEC) 10 MG tablet Take 1 tablet (10 mg total) by mouth daily. (Patient not taking: Reported on 05/30/2017) 30 tablet 1  . Naltrexone-Bupropion HCl ER 8-90 MG TB12 Take 1 tablet by mouth 2 (two) times daily. Start with1 tablet daily in the morning then 1 tab twice daily (Patient not taking: Reported on 05/30/2017) 60 tablet 2  . norgestimate-ethinyl estradiol (SPRINTEC 28) 0.25-35 MG-MCG tablet Take 1 tablet by mouth daily. (Patient not taking: Reported on 12/23/2016) 1 Package 6  . ondansetron (ZOFRAN) 8 MG tablet Take 1 tablet (8 mg total) by mouth every 8 (eight) hours as  needed for nausea or vomiting. (Patient not taking: Reported on 05/30/2017) 20 tablet 0  . sulfamethoxazole-trimethoprim (BACTRIM DS,SEPTRA DS) 800-160 MG tablet Take 1 tablet by mouth 2 (two) times daily. (Patient not taking: Reported on 05/30/2017) 20 tablet 0  . traMADol (ULTRAM) 50 MG tablet Take 1 tablet (50 mg total) by mouth every 12 (twelve) hours as needed. (Patient not taking: Reported on 03/11/2017) 40 tablet 1   No facility-administered medications prior to visit.     ROS Review of Systems  Constitutional: Negative for activity change, appetite change and fatigue.  HENT: Negative for congestion, sinus pressure and sore throat.   Eyes: Negative for visual disturbance.  Respiratory: Negative for cough, chest tightness, shortness of breath and wheezing.   Cardiovascular: Negative for chest pain and palpitations.  Gastrointestinal: Negative for abdominal distention, abdominal pain and constipation.  Endocrine: Negative for polydipsia.  Genitourinary: Negative for dysuria and frequency.  Musculoskeletal: Negative for arthralgias and back pain.  Skin: Negative for rash.  Neurological: Positive for headaches. Negative for tremors, light-headedness and numbness.  Hematological: Does not bruise/bleed easily.  Psychiatric/Behavioral: Negative for agitation and behavioral problems.    Objective:  BP 110/74   Pulse 69   Temp 98.5 F (36.9 C) (Oral)   Ht 5\' 4"  (1.626 m)   Wt 237 lb 6.4 oz (107.7 kg)   SpO2 99%   BMI 40.75 kg/m   BP/Weight 05/30/2017 03/11/2017 2/84/1324  Systolic BP 401 027 253  Diastolic BP  74 72 75  Wt. (Lbs) 237.4 234 235.4  BMI 40.75 40.17 40.41      Physical Exam  Constitutional: She is oriented to person, place, and time. She appears well-developed and well-nourished.  Cardiovascular: Normal rate, normal heart sounds and intact distal pulses.   No murmur heard. Pulmonary/Chest: Effort normal and breath sounds normal. She has no wheezes. She has no  rales. She exhibits no tenderness.  Abdominal: Soft. Bowel sounds are normal. She exhibits no distension and no mass. There is no tenderness.  Musculoskeletal: Normal range of motion.  Neurological: She is alert and oriented to person, place, and time.  Skin: Skin is warm and dry.  Psychiatric: She has a normal mood and affect.     Assessment & Plan:   1. Gastroesophageal reflux disease without esophagitis Commence PPI Avoid late meals - omeprazole (PRILOSEC) 20 MG capsule; Take 1 capsule (20 mg total) by mouth daily.  Dispense: 30 capsule; Refill: 3  2. Sinus headache Commence on Zyrtec - cetirizine (ZYRTEC) 10 MG tablet; Take 1 tablet (10 mg total) by mouth daily.  Dispense: 30 tablet; Refill: 1  3. History of migraine Placed on Topamax for prophylaxis which will also help with weight loss   Meds ordered this encounter  Medications  . cetirizine (ZYRTEC) 10 MG tablet    Sig: Take 1 tablet (10 mg total) by mouth daily.    Dispense:  30 tablet    Refill:  1  . topiramate (TOPAMAX) 50 MG tablet    Sig: Take 1 tablet (50 mg total) by mouth 2 (two) times daily.    Dispense:  60 tablet    Refill:  3  . omeprazole (PRILOSEC) 20 MG capsule    Sig: Take 1 capsule (20 mg total) by mouth daily.    Dispense:  30 capsule    Refill:  3    Follow-up: Return in about 3 months (around 08/30/2017) for follow up on chronic medical conditions.   Arnoldo Morale MD

## 2017-05-30 NOTE — Patient Instructions (Signed)
Gastroesophageal Reflux Scan A gastroesophageal reflux scan is a procedure that is used to check for gastroesophageal reflux, which is the backward flow of stomach contents into the tube that carries food from the mouth to the stomach (esophagus). The scan can also show if any stomach contents are inhaled (aspirated) into your lungs. You may need this scan if you have symptoms such as heartburn, vomiting, swallowing problems, or regurgitation. Regurgitation means that swallowed food is returning from the stomach to the esophagus. For this scan, you will drink a liquid that contains a small amount of a radioactive substance (tracer). A scanner with a camera that detects the radioactive tracer is used to see if any of the material backs up into your esophagus. Tell a health care provider about:  Any allergies you have.  All medicines you are taking, including vitamins, herbs, eye drops, creams, and over-the-counter medicines.  Any blood disorders you have.  Any surgeries you have had.  Any medical conditions you have.  If you are pregnant or you think that you may be pregnant.  If you are breastfeeding. What are the risks? Generally, this is a safe procedure. However, problems may occur, including:  Exposure to radiation (a small amount).  Allergic reaction to the radioactive substance. This is rare.  What happens before the procedure?  Ask your health care provider about changing or stopping your regular medicines. This is especially important if you are taking diabetes medicines or blood thinners.  Follow your health care provider's instructions about eating or drinking restrictions. What happens during the procedure?  You will be asked to drink a liquid that contains a small amount of a radioactive tracer. This liquid will probably be similar to orange juice.  You will assume a position lying on your back.  A series of images will be taken of your esophagus and upper  stomach.  You may be asked to move into different positions to help determine if reflux occurs more often when you are in specific positions.  For adults, an abdominal binder with an inflatable cuff may be placed on the belly (abdomen). This may be used to increase abdominal pressure. More images will be taken to see if the increased pressure causes reflux to occur. The procedure may vary among health care providers and hospitals. What happens after the procedure?  Return to your normal activities and your normal diet as directed by your health care provider.  The radioactive tracer will leave your body over the next few days. Drink enough fluid to keep your urine clear or pale yellow. This will help to flush the tracer out of your body.  It is your responsibility to obtain your test results. Ask your health care provider or the department performing the test when and how you will get your results. This information is not intended to replace advice given to you by your health care provider. Make sure you discuss any questions you have with your health care provider. Document Released: 09/12/2005 Document Revised: 04/15/2016 Document Reviewed: 05/03/2014 Elsevier Interactive Patient Education  2018 Elsevier Inc.  

## 2017-08-01 ENCOUNTER — Ambulatory Visit: Payer: Medicaid Other | Attending: Family Medicine | Admitting: Family Medicine

## 2017-08-01 ENCOUNTER — Encounter: Payer: Self-pay | Admitting: Family Medicine

## 2017-08-01 VITALS — BP 104/69 | HR 78 | Temp 98.9°F | Resp 16 | Ht 64.0 in | Wt 233.8 lb

## 2017-08-01 DIAGNOSIS — L02419 Cutaneous abscess of limb, unspecified: Secondary | ICD-10-CM | POA: Insufficient documentation

## 2017-08-01 DIAGNOSIS — L732 Hidradenitis suppurativa: Secondary | ICD-10-CM | POA: Insufficient documentation

## 2017-08-01 DIAGNOSIS — Z79899 Other long term (current) drug therapy: Secondary | ICD-10-CM | POA: Diagnosis not present

## 2017-08-01 DIAGNOSIS — L989 Disorder of the skin and subcutaneous tissue, unspecified: Secondary | ICD-10-CM | POA: Diagnosis present

## 2017-08-01 MED ORDER — SULFAMETHOXAZOLE-TRIMETHOPRIM 800-160 MG PO TABS: 1.0000 | ORAL_TABLET | Freq: Two times a day (BID) | ORAL | 0 refills | Status: DC

## 2017-08-01 MED ORDER — MUPIROCIN 2 % EX OINT: TOPICAL_OINTMENT | Freq: Two times a day (BID) | CUTANEOUS | 0 refills | Status: DC

## 2017-08-01 NOTE — Progress Notes (Signed)
Subjective:  Patient ID: Teresa Riddle, female    DOB: [birth year]  Age: 31 y.o. MRN: [id number]  CC: Skin lesions   HPI Teresa Riddle presents for skin complaint. Onset: Ongoing. She reports worsening of symptoms last weekend. Lesions started on left leg then distributed to bilateral buttocks, scalp, abdomen, groin, and right axilla. Symptoms inc.pus-like drainage pus-like, thick, non odorous. She reports using medicated band aids and mupirocin ointment for symptoms.  She reports having wisdom teeth and pulled two days ago and is currently on penicillin.    Outpatient Medications Prior to Visit  Medication Sig Dispense Refill  . chlorhexidine (PERIDEX) 0.12 % solution Use as directed 15 mLs in the mouth or throat 2 (two) times daily.    Marland Kitchen HYDROcodone-acetaminophen (NORCO/VICODIN) 5-325 MG tablet Take 1 tablet by mouth every 6 (six) hours as needed for moderate pain.    Marland Kitchen ibuprofen (ADVIL,MOTRIN) 600 MG tablet Take 1 tablet (600 mg total) by mouth every 8 (eight) hours as needed. 60 tablet 1  . penicillin v potassium (VEETID) 500 MG tablet Take 500 mg by mouth 4 (four) times daily.    . cetirizine (ZYRTEC) 10 MG tablet Take 1 tablet (10 mg total) by mouth daily. (Patient not taking: Reported on 08/01/2017) 30 tablet 1  . omeprazole (PRILOSEC) 20 MG capsule Take 1 capsule (20 mg total) by mouth daily. (Patient not taking: Reported on 08/01/2017) 30 capsule 3  . topiramate (TOPAMAX) 50 MG tablet Take 1 tablet (50 mg total) by mouth 2 (two) times daily. (Patient not taking: Reported on 08/01/2017) 60 tablet 3  . mupirocin ointment (BACTROBAN) 2 % Apply bid to AA x 7days (Patient not taking: Reported on 05/30/2017) 22 g 0   No facility-administered medications prior to visit.     ROS Review of Systems  Constitutional: Negative.   Respiratory: Negative.   Cardiovascular: Negative.   Skin:       Skin lesions   Objective:  BP 104/69 (BP Location: Left Arm, Patient Position:  Sitting, Cuff Size: Large)   Pulse 78   Temp 98.9 F (37.2 C) (Oral)   Resp 16   Ht 5\' 4"  (1.626 m)   Wt 233 lb 12.8 oz (106.1 kg)   SpO2 99%   BMI 40.13 kg/m   BP/Weight 08/01/2017 20/25/4270 01/04/3761  Systolic BP 831 517 616  Diastolic BP 69 74 72  Wt. (Lbs) 233.8 237.4 234  BMI 40.13 40.75 40.17    Physical Exam  Constitutional: She appears well-developed and well-nourished.  Cardiovascular: Normal rate, regular rhythm, normal heart sounds and intact distal pulses.  Pulmonary/Chest: Effort normal and breath sounds normal.  Skin: Skin is warm and dry. Lesion (pustles to  lateral left abdomen; healed lesions to right lower leg, bilateral buttocks, and right groin; pustles and cyst like lesions to right axilla,) noted.  Psychiatric: She has a normal mood and affect.  Nursing note and vitals reviewed.   Assessment & Plan:   1. Hidradenitis suppurativa Culture of drainage was obtained from right axilla, cleansed, and covered. - WOUND CULTURE - Ambulatory referral to Dermatology - sulfamethoxazole-trimethoprim (BACTRIM DS,SEPTRA DS) 800-160 MG tablet; Take 1 tablet by mouth 2 (two) times daily.  Dispense: 10 tablet; Refill: 0 - mupirocin ointment (BACTROBAN) 2 %; Apply topically 2 (two) times daily. Apply to affected areas for 7 days.  Dispense: 22 g; Refill: 0  2. Abscess of axilla  - WOUND CULTURE - Ambulatory referral to Dermatology - sulfamethoxazole-trimethoprim (BACTRIM DS,SEPTRA DS) 800-160  MG tablet; Take 1 tablet by mouth 2 (two) times daily.  Dispense: 10 tablet; Refill: 0    Follow-up: Return if symptoms worsen or fail to improve.   Alfonse Spruce FNP

## 2017-08-01 NOTE — Patient Instructions (Signed)
Hidradenitis Suppurativa Hidradenitis suppurativa is a long-term (chronic) skin disease that starts with blocked sweat glands or hair follicles. Bacteria may grow in these blocked openings of your skin. Hidradenitis suppurativa is like a severe form of acne that develops in areas of your body where acne would be unusual. It is most likely to affect the areas of your body where skin rubs against skin and becomes moist. This includes your:  Underarms.  Groin.  Genital areas.  Buttocks.  Upper thighs.  Breasts.  Hidradenitis suppurativa may start out with small pimples. The pimples can develop into deep sores that break open (rupture) and drain pus. Over time your skin may thicken and become scarred. Hidradenitis suppurativa cannot be passed from person to person. What are the causes? The exact cause of hidradenitis suppurativa is not known. This condition may be due to:  Female and female hormones. The condition is rare before and after puberty.  An overactive body defense system (immune system). Your immune system may overreact to the blocked hair follicles or sweat glands and cause swelling and pus-filled sores.  What increases the risk? You may have a higher risk of hidradenitis suppurativa if you:  Are a woman.  Are between ages 11 and 55.  Have a family history of hidradenitis suppurativa.  Have a personal history of acne.  Are overweight.  Smoke.  Take the drug lithium.  What are the signs or symptoms? The first signs of an outbreak are usually painful skin bumps that look like pimples. As the condition progresses:  Skin bumps may get bigger and grow deeper into the skin.  Bumps under the skin may rupture and drain smelly pus.  Skin may become itchy and infected.  Skin may thicken and scar.  Drainage may continue through tunnels under the skin (fistulas).  Walking and moving your arms can become painful.  How is this diagnosed? Your health care provider may  diagnose hidradenitis suppurativa based on your medical history and your signs and symptoms. A physical exam will also be done. You may need to see a health care provider who specializes in skin diseases (dermatologist). You may also have tests done to confirm the diagnosis. These can include:  Swabbing a sample of pus or drainage from your skin so it can be sent to the lab and tested for infection.  Blood tests to check for infection.  How is this treated? The same treatment will not work for everybody with hidradenitis suppurativa. Your treatment will depend on how severe your symptoms are. You may need to try several treatments to find what works best for you. Part of your treatment may include cleaning and bandaging (dressing) your wounds. You may also have to take medicines, such as the following:  Antibiotics.  Acne medicines.  Medicines to block or suppress the immune system.  A diabetes medicine (metformin) is sometimes used to treat this condition.  For women, birth control pills can sometimes help relieve symptoms.  You may need surgery if you have a severe case of hidradenitis suppurativa that does not respond to medicine. Surgery may involve:  Using a laser to clear the skin and remove hair follicles.  Opening and draining deep sores.  Removing the areas of skin that are diseased and scarred.  Follow these instructions at home:  Learn as much as you can about your disease, and work closely with your health care providers.  Take medicines only as directed by your health care provider.  If you were prescribed   an antibiotic medicine, finish it all even if you start to feel better.  If you are overweight, losing weight may be very helpful. Try to reach and maintain a healthy weight.  Do not use any tobacco products, including cigarettes, chewing tobacco, or electronic cigarettes. If you need help quitting, ask your health care provider.  Do not shave the areas where you  get hidradenitis suppurativa.  Do not wear deodorant.  Wear loose-fitting clothes.  Try not to overheat and get sweaty.  Take a daily bleach bath as directed by your health care provider. ? Fill your bathtub halfway with water. ? Pour in  cup of unscented household bleach. ? Soak for 5-10 minutes.  Cover sore areas with a warm, clean washcloth (compress) for 5-10 minutes. Contact a health care provider if:  You have a flare-up of hidradenitis suppurativa.  You have chills or a fever.  You are having trouble controlling your symptoms at home. This information is not intended to replace advice given to you by your health care provider. Make sure you discuss any questions you have with your health care provider. Document Released: 03/05/2004 Document Revised: 12/28/2015 Document Reviewed: 10/22/2013 Elsevier Interactive Patient Education  2018 Elsevier Inc.  

## 2017-08-01 NOTE — Progress Notes (Signed)
Patient stated she have big knot on her right armpit and it's giving her pain since last week. Patient stated her head bump is getting better by applying Patient stated she have bumps on her right leg and between her groin. cream on it. Patient stated she just got her wisdom tooth pulled out.

## 2017-08-05 LAB — WOUND CULTURE

## 2017-08-07 ENCOUNTER — Encounter: Payer: Self-pay | Admitting: Family Medicine

## 2017-08-07 NOTE — Telephone Encounter (Signed)
-----   Message from Alfonse Spruce, [place] sent at 08/07/2017  1:35 PM EST ----- Would culture positive for MRSA staph aureus with heavy growth. Continue treatment. Follow up with dermatology referral.

## 2017-08-07 NOTE — Telephone Encounter (Signed)
-----   Message from Alfonse Spruce, [place] sent at 08/07/2017  1:35 PM EST ----- Would culture positive for MRSA staph aureus with heavy growth. Continue treatment. Follow up with dermatology referral.

## 2017-08-07 NOTE — Telephone Encounter (Signed)
-----   Message from Alfonse Spruce, [place] sent at 08/07/2017  1:35 PM EST ----- Would culture positive for MRSA staph aureus with heavy growth. Continue treatment. Follow up with dermatology referral.

## 2017-08-07 NOTE — Telephone Encounter (Signed)
CMA attempt to call patient regarding wound culture result and no answer. Patient allow to leave a detail message on [phone]. Left the wound culture result on voicemail.

## 2017-09-01 ENCOUNTER — Ambulatory Visit: Payer: Self-pay | Admitting: Family Medicine

## 2017-09-03 ENCOUNTER — Ambulatory Visit: Payer: Medicaid Other | Attending: Family Medicine | Admitting: Family Medicine

## 2017-09-03 ENCOUNTER — Encounter: Payer: Self-pay | Admitting: Family Medicine

## 2017-09-03 VITALS — BP 126/80 | HR 82 | Temp 97.8°F | Ht 64.0 in | Wt 242.8 lb

## 2017-09-03 DIAGNOSIS — F419 Anxiety disorder, unspecified: Secondary | ICD-10-CM | POA: Diagnosis not present

## 2017-09-03 DIAGNOSIS — B373 Candidiasis of vulva and vagina: Secondary | ICD-10-CM

## 2017-09-03 DIAGNOSIS — L0292 Furuncle, unspecified: Secondary | ICD-10-CM | POA: Diagnosis not present

## 2017-09-03 DIAGNOSIS — Z79899 Other long term (current) drug therapy: Secondary | ICD-10-CM | POA: Insufficient documentation

## 2017-09-03 DIAGNOSIS — A4902 Methicillin resistant Staphylococcus aureus infection, unspecified site: Secondary | ICD-10-CM | POA: Diagnosis not present

## 2017-09-03 DIAGNOSIS — B3731 Acute candidiasis of vulva and vagina: Secondary | ICD-10-CM

## 2017-09-03 DIAGNOSIS — R21 Rash and other nonspecific skin eruption: Secondary | ICD-10-CM | POA: Diagnosis present

## 2017-09-03 MED ORDER — FLUCONAZOLE 150 MG PO TABS: 150.0000 mg | ORAL_TABLET | Freq: Once | ORAL | 0 refills | Status: AC

## 2017-09-03 MED ORDER — CHLORHEXIDINE GLUCONATE 4 % EX LIQD: Freq: Every day | CUTANEOUS | 0 refills | Status: DC | PRN

## 2017-09-03 NOTE — Progress Notes (Signed)
Patient has spots on legs and buttocks.

## 2017-09-03 NOTE — Progress Notes (Signed)
Subjective:  Patient ID: Teresa Riddle, female    DOB: 05-04-1986  Age: 32 y.o. MRN: [id number]  CC: Rash   HPI Teresa Riddle is a 32 year old female who was seen by the nurse practitioner last month for furuncles with purulent discharge and culture came back positive for MRSA. She completed a course of Bactrim and reports resolution of lesions but scars remain and she is concerned about possible recurrence.  She was referred to a dermatologist and she is yet to obtain an appointment. She provides a remote history of breaking out in boils when she was much younger. Prior to treatment with Bactrim she was on penicillin after a tooth extraction and now complains of vaginal itching and burning with whitish discharge.  Past Medical History:  Diagnosis Date  . Anxiety     Past Surgical History:  Procedure Laterality Date  . DILATION AND CURETTAGE OF UTERUS      Allergies  Allergen Reactions  . Doxycycline Hyclate     Dry mouth, bumps on mouth      Outpatient Medications Prior to Visit  Medication Sig Dispense Refill  . cetirizine (ZYRTEC) 10 MG tablet Take 1 tablet (10 mg total) by mouth daily. 30 tablet 1  . ibuprofen (ADVIL,MOTRIN) 600 MG tablet Take 1 tablet (600 mg total) by mouth every 8 (eight) hours as needed. 60 tablet 1  . chlorhexidine (PERIDEX) 0.12 % solution Use as directed 15 mLs in the mouth or throat 2 (two) times daily.    Marland Kitchen HYDROcodone-acetaminophen (NORCO/VICODIN) 5-325 MG tablet Take 1 tablet by mouth every 6 (six) hours as needed for moderate pain.    . mupirocin ointment (BACTROBAN) 2 % Apply topically 2 (two) times daily. Apply to affected areas for 7 days. (Patient not taking: Reported on 09/03/2017) 22 g 0  . omeprazole (PRILOSEC) 20 MG capsule Take 1 capsule (20 mg total) by mouth daily. (Patient not taking: Reported on 08/01/2017) 30 capsule 3  . topiramate (TOPAMAX) 50 MG tablet Take 1 tablet (50 mg total) by mouth 2 (two) times daily. (Patient not  taking: Reported on 08/01/2017) 60 tablet 3  . penicillin v potassium (VEETID) 500 MG tablet Take 500 mg by mouth 4 (four) times daily.    Marland Kitchen sulfamethoxazole-trimethoprim (BACTRIM DS,SEPTRA DS) 800-160 MG tablet Take 1 tablet by mouth 2 (two) times daily. (Patient not taking: Reported on 09/03/2017) 10 tablet 0   No facility-administered medications prior to visit.     ROS Review of Systems  Constitutional: Negative for activity change, appetite change and fatigue.  HENT: Negative for congestion, sinus pressure and sore throat.   Eyes: Negative for visual disturbance.  Respiratory: Negative for cough, chest tightness, shortness of breath and wheezing.   Cardiovascular: Negative for chest pain and palpitations.  Gastrointestinal: Negative for abdominal distention, abdominal pain and constipation.  Endocrine: Negative for polydipsia.  Genitourinary: Positive for vaginal discharge. Negative for dysuria and frequency.  Musculoskeletal: Negative for arthralgias and back pain.  Skin: Positive for rash.  Neurological: Negative for tremors, light-headedness and numbness.  Hematological: Does not bruise/bleed easily.  Psychiatric/Behavioral: Negative for agitation and behavioral problems.    Objective:  BP 126/80   Pulse 82   Temp 97.8 F (36.6 C) (Oral)   Ht 5\' 4"  (1.626 m)   Wt 242 lb 12.8 oz (110.1 kg)   SpO2 100%   BMI 41.68 kg/m   BP/Weight 09/03/2017 08/01/2017 52/77/8242  Systolic BP 353 614 431  Diastolic BP 80 69 74  Wt. (Lbs) 242.8 233.8 237.4  BMI 41.68 40.13 40.75      Physical Exam  Constitutional: She is oriented to person, place, and time. She appears well-developed and well-nourished.  Cardiovascular: Normal rate, normal heart sounds and intact distal pulses.  No murmur heard. Pulmonary/Chest: Effort normal and breath sounds normal. She has no wheezes. She has no rales. She exhibits no tenderness.  Abdominal: Soft. Bowel sounds are normal. She exhibits no  distension and no mass. There is no tenderness.  Musculoskeletal: Normal range of motion.  Neurological: She is alert and oriented to person, place, and time.  Skin:  Hyperpigmented lesions from resolving furuncles     Assessment & Plan:   1. Furunculosis Awaiting appointment with dermatology - chlorhexidine (HIBICLENS) 4 % external liquid; Apply topically daily as needed.  Dispense: 120 mL; Refill: 0  2. MRSA infection Completed course of Bactrim We have discussed origin of MRSA, carrier status she has been provided with information regarding this.  3. Vaginal candidiasis Secondary to multiple antibiotic use - fluconazole (DIFLUCAN) 150 MG tablet; Take 1 tablet (150 mg total) by mouth once for 1 dose.  Dispense: 1 tablet; Refill: 0   Meds ordered this encounter  Medications  . fluconazole (DIFLUCAN) 150 MG tablet    Sig: Take 1 tablet (150 mg total) by mouth once for 1 dose.    Dispense:  1 tablet    Refill:  0  . chlorhexidine (HIBICLENS) 4 % external liquid    Sig: Apply topically daily as needed.    Dispense:  120 mL    Refill:  0    Follow-up: Return if symptoms worsen or fail to improve.   Charlott Rakes MD

## 2017-09-03 NOTE — Patient Instructions (Signed)
FAQs about MRSA (Methicillin-Resistant Staphylococcus aureus)  What is MRSA?  Staphylococcus aureus (pronounced staff-ill-oh-KOK-us AW-ree-us), or "Staph" is a very common germ that about 1 out of every 3 people have on their skin or in their nose. This germ does not cause any problems for most people who have it on their skin. But sometimes it can cause serious infections such as skin or wound infections, pneumonia, or infections of the blood.  Antibiotics are given to kill Staph germs when they cause infections. Some Staph are resistant, meaning they cannot be killed by some antibiotics. "Methicillin-resistant Staphylococcus aureus" or "MRSA" is a type of Staph that is resistant to some of the antibiotics that are often used to treat Staph infections.  Who is most likely to get an MRSA infection?  In the hospital, people who are more likely to get an MRSA infection are people who:   have other health conditions making them sick   have been in the hospital or a nursing home   have been treated with antibiotics.    People who are healthy and who have not been in the hospital or a nursing home can also get MRSA infections. These infections usually involve the skin. More information about this type of MRSA infection, known as "community-associated MRSA" infection, is available from the Centers for Disease Control and Prevention (CDC). http://www.cdc.gov/mrsa  How do I get an MRSA infection?  People who have MRSA germs on their skin or who are infected with MRSA may be able to spread the germ to other people. MRSA can be passed on to bed linens, bed rails, bathroom fixtures, and medical equipment. It can spread to other people on contaminated equipment and on the hands of doctors, nurses, other healthcare providers and visitors.  Can MRSA infections be treated?  Yes, there are antibiotics that can kill MRSA germs. Some patients with MRSA abscesses may need surgery to drain the infection. Your healthcare provider  will determine which treatments are best for you.  What are some of the things that hospitals are doing to prevent MRSA infections?  To prevent MRSA infections, doctors, nurses and other healthcare providers:   Clean their hands with soap and water or an alcohol-based hand rub before and after caring for every patient.   Carefully clean hospital rooms and medical equipment.   Use Contact Precautions when caring for patients with MRSA. Contact Precautions mean:  ? Whenever possible, patients with MRSA will have a single room or will share a room only with someone else who also has MRSA.  ? Healthcare providers will put on gloves and wear a gown over their clothing while taking care of patients with MRSA.  ? Visitors may also be asked to wear a gown and gloves.  ? When leaving the room, hospital providers and visitors remove their gown and gloves and clean their hands.  ? Patients on Contact Precautions are asked to stay in their hospital rooms as much as possible. They should not go to common areas, such as the gift shop or cafeteria. They may go to other areas of the hospital for treatments and tests.   May test some patients to see if they have MRSA on their skin. This test involves rubbing a cotton-tipped swab in the patient's nostrils or on the skin.    What can I do to help prevent MRSA infections?  In the hospital   Make sure that all doctors, nurses, and other healthcare providers clean their hands with soap and   water or an alcohol-based hand rub before and after caring for you.    If you do not see your providers clean their hands, please ask them to do so.  When you go home   If you have wounds or an intravascular device (such as a catheter or dialysis port) make sure that you know how to take care of them.    Can my friends and family get MRSA when they visit me?  The chance of getting MRSA while visiting a person who has MRSA is very low. To decrease the chance of getting MRSA your family and friends  should:   Clean their hands before they enter your room and when they leave.   Ask a healthcare provider if they need to wear protective gowns and gloves when they visit you.    What do I need to do when I go home from the hospital?  To prevent another MRSA infection and to prevent spreading MRSA to others:   Keep taking any antibiotics prescribed by your doctor. Don't take half-doses or stop before you complete your prescribed course.   Clean your hands often, especially before and after changing your wound dressing or bandage.   People who live with you should clean their hands often as well.   Keep any wounds clean and change bandages as instructed until healed.   Avoid sharing personal items such as towels or razors.   Wash and dry your clothes and bed linens in the warmest temperatures recommended on the labels.   Tell your healthcare providers that you have MRSA. This includes home health nurses and aides, therapists, and personnel in doctors' offices.   Your doctor may have more instructions for you.    If you have questions, please ask your doctor or nurse.  Co-sponsored by The Society for Healthcare Epidemiology of America (SHEA); Infectious Diseases Society of America (IDSA); American Hospital Association; Association for Professionals in Infection Control and Epidemiology (APIC); Centers for Disease Control and Prevention (CDC); and The Joint Commission.  This information is not intended to replace advice given to you by your health care provider. Make sure you discuss any questions you have with your health care provider.  Document Released: 07/27/2013 Document Revised: 12/28/2015 Document Reviewed: 10/05/2014  Elsevier Interactive Patient Education  2018 Elsevier Inc.

## 2017-09-26 ENCOUNTER — Telehealth: Payer: Self-pay | Admitting: Family Medicine

## 2017-09-26 MED ORDER — SULFAMETHOXAZOLE-TRIMETHOPRIM 800-160 MG PO TABS: 1.0000 | ORAL_TABLET | Freq: Two times a day (BID) | ORAL | 0 refills | Status: DC

## 2017-09-26 NOTE — Telephone Encounter (Signed)
Pt called to request a sulfamethoxazole-trimethoprim (BACTRIM DS,SEPTRA DS) 800-160 MG tablet Since she has another bump in her head and the appt with the dermatology is not until march, so she need this, since is painfully and she does not want to seat on the Ed for this  please call Pt if this has been [place] or not

## 2017-09-26 NOTE — Telephone Encounter (Signed)
I cannot approve this, will forward to PCP

## 2017-09-26 NOTE — Telephone Encounter (Signed)
Rx sent to her pharmacy 

## 2018-03-25 ENCOUNTER — Ambulatory Visit: Payer: Medicaid Other | Attending: Family Medicine | Admitting: Physician Assistant

## 2018-03-25 VITALS — BP 116/83 | HR 93 | Temp 98.4°F | Resp 16 | Wt 258.4 lb

## 2018-03-25 DIAGNOSIS — Z881 Allergy status to other antibiotic agents status: Secondary | ICD-10-CM | POA: Diagnosis not present

## 2018-03-25 DIAGNOSIS — Z3A00 Weeks of gestation of pregnancy not specified: Secondary | ICD-10-CM | POA: Diagnosis not present

## 2018-03-25 DIAGNOSIS — O200 Threatened abortion: Secondary | ICD-10-CM | POA: Insufficient documentation

## 2018-03-25 DIAGNOSIS — Z30015 Encounter for initial prescription of vaginal ring hormonal contraceptive: Secondary | ICD-10-CM | POA: Diagnosis not present

## 2018-03-25 DIAGNOSIS — N939 Abnormal uterine and vaginal bleeding, unspecified: Secondary | ICD-10-CM | POA: Diagnosis present

## 2018-03-25 LAB — POCT URINE PREGNANCY: PREG TEST UR: POSITIVE — AB

## 2018-03-25 MED ORDER — ETONOGESTREL-ETHINYL ESTRADIOL 0.12-0.015 MG/24HR VA RING: VAGINAL_RING | VAGINAL | 12 refills | Status: DC

## 2018-03-25 NOTE — Progress Notes (Signed)
Patient ID: Teresa Riddle, female   DOB: [birth year], 32 y.o.   MRN: [id number]   Teresa Riddle, is a 32 y.o. female  MGQ:[id number]  OIZ:[id number]  DOB - [birth year]  Subjective:  Chief Complaint and HPI: Teresa Riddle is a 32 y.o. female here today after having a positive pregnancy test at home.  LMP 02/06/2018.  Took plan B on 02/28/2018 after unprotected IC.  started bleeding/gush of blood and tissue 03/21/2018 and bled for 2 days.  Still having some dark blood.  No pain.  Urine pregnancy test still positive.  No N/V.    If this is a miscarriage, she is interested in Nuva ring for [facility].     ROS:   Constitutional:  No f/c, No night sweats, No unexplained weight loss. EENT:  No vision changes, No blurry vision, No hearing changes. No mouth, throat, or ear problems.  Respiratory: No cough, No SOB Cardiac: No CP, no palpitations GI:  No abd pain, No N/V/D. GU: No Urinary s/sx Musculoskeletal: No joint pain Neuro: No headache, no dizziness, no motor weakness.  Skin: No rash Endocrine:  No polydipsia. No polyuria.  Psych: Denies SI/HI  No problems updated.  ALLERGIES: Allergies  Allergen Reactions  . Doxycycline Hyclate     Dry mouth, bumps on mouth     PAST MEDICAL HISTORY: Past Medical History:  Diagnosis Date  . Anxiety     MEDICATIONS AT HOME: Prior to Admission medications   Medication Sig Start Date End Date Taking? Authorizing Provider  cetirizine (ZYRTEC) 10 MG tablet Take 1 tablet (10 mg total) by mouth daily. Patient not taking: Reported on 03/25/2018 05/30/17   Charlott Rakes, MD  chlorhexidine (HIBICLENS) 4 % external liquid Apply topically daily as needed. Patient not taking: Reported on 03/25/2018 09/03/17   Charlott Rakes, MD  chlorhexidine (PERIDEX) 0.12 % solution Use as directed 15 mLs in the mouth or throat 2 (two) times daily.    [provider]  etonogestrel-ethinyl estradiol (NUVARING) 0.12-0.015 MG/24HR vaginal ring Insert vaginally and  leave in place for 3 consecutive weeks, then remove for 1 week. 03/25/18   Argentina Donovan, PA-C  HYDROcodone-acetaminophen (NORCO/VICODIN) 5-325 MG tablet Take 1 tablet by mouth every 6 (six) hours as needed for moderate pain.    [provider]  ibuprofen (ADVIL,MOTRIN) 600 MG tablet Take 1 tablet (600 mg total) by mouth every 8 (eight) hours as needed. Patient not taking: Reported on 03/25/2018 02/25/17   Charlott Rakes, MD  mupirocin ointment (BACTROBAN) 2 % Apply topically 2 (two) times daily. Apply to affected areas for 7 days. Patient not taking: Reported on 09/03/2017 08/01/17   Alfonse Spruce, FNP  omeprazole (PRILOSEC) 20 MG capsule Take 1 capsule (20 mg total) by mouth daily. Patient not taking: Reported on 08/01/2017 05/30/17   Charlott Rakes, MD  sulfamethoxazole-trimethoprim (BACTRIM DS,SEPTRA DS) 800-160 MG tablet Take 1 tablet by mouth 2 (two) times daily. Patient not taking: Reported on 03/25/2018 09/26/17   Charlott Rakes, MD  topiramate (TOPAMAX) 50 MG tablet Take 1 tablet (50 mg total) by mouth 2 (two) times daily. Patient not taking: Reported on 08/01/2017 05/30/17   Charlott Rakes, MD     Objective:  EXAM:   Vitals:   03/25/18 1422  BP: 116/83  Pulse: 93  Resp: 16  Temp: 98.4 F (36.9 C)  TempSrc: Oral  SpO2: 98%  Weight: 258 lb 6.4 oz (117.2 kg)    General appearance : A&OX3. NAD. Non-toxic-appearing HEENT: Atraumatic and Normocephalic.  PERRLA. EOM intact. Neck: supple, no JVD. No cervical lymphadenopathy. No thyromegaly Chest/Lungs:  Breathing-non-labored, Good air entry bilaterally, breath sounds normal without rales, rhonchi, or wheezing  CVS: S1 S2 regular, no murmurs, gallops, rubs  Pelvic exam deferred bc no pain or bleeding now.  Extremities: Bilateral Lower Ext shows no edema, both legs are warm to touch with = pulse throughout Neurology:  CN II-XII grossly intact, Non focal.   Psych:  TP linear. J/I WNL. Normal speech. Appropriate  eye contact and affect.  Skin:  No Rash  Data Review No results found for: HGBA1C   Assessment & Plan   1. Threatened abortion - Beta hCG quant (ref lab) - US PELVIC COMPLETE WITH TRANSVAGINAL; Future to confirm pregnancy vs threatened AB.   - POCT urine pregnancy Repeat in 48hrs - Beta hCG quant (ref lab); Future  2. Encounter for initial prescription of vaginal ring hormonal contraceptive Hold for now.  she will abstain from sex until testing is completed.  Discussed decreased effectiveness of BC while on topamax.  She only takes 100mg  total and decreased effectiveness is at 200mg  and over. She will only start Nuva ring if not pregnant and after all POC are expelled or next menstrual cycle.   - etonogestrel-ethinyl estradiol (NUVARING) 0.12-0.015 MG/24HR vaginal ring; Insert vaginally and leave in place for 3 consecutive weeks, then remove for 1 week.  Dispense: 1 each; Refill: 12 To ED if any pain develops or bleeding returns.   Patient have been counseled extensively about nutrition and exercise  Return for predicated on lab/test results.  The patient was given clear instructions to go to ER or return to medical center if symptoms don't improve, worsen or new problems develop. The patient verbalized understanding. The patient was told to call to get lab results if they haven't heard anything in the next week.     Freeman Caldron, PA-C [facility] and [facility] [place], [place]   03/25/2018, 2:48 PM

## 2018-03-26 ENCOUNTER — Telehealth: Payer: Self-pay | Admitting: Family Medicine

## 2018-03-26 LAB — BETA HCG QUANT (REF LAB): hCG Quant: 16279 m[IU]/mL

## 2018-03-26 NOTE — Telephone Encounter (Signed)
Prior Auth for imaging has has been approved.  Case# [id number]

## 2018-03-26 NOTE — Telephone Encounter (Signed)
Preserve for pelves complete needs to be done.

## 2018-03-27 ENCOUNTER — Ambulatory Visit: Payer: Medicaid Other | Attending: Family Medicine

## 2018-03-27 ENCOUNTER — Other Ambulatory Visit: Payer: Self-pay

## 2018-03-27 DIAGNOSIS — Z3A00 Weeks of gestation of pregnancy not specified: Secondary | ICD-10-CM | POA: Insufficient documentation

## 2018-03-27 DIAGNOSIS — O200 Threatened abortion: Secondary | ICD-10-CM | POA: Diagnosis not present

## 2018-03-27 NOTE — Progress Notes (Signed)
Patient here for lab visit only 

## 2018-03-28 LAB — BETA HCG QUANT (REF LAB): HCG QUANT: 19611 m[IU]/mL

## 2018-03-30 ENCOUNTER — Telehealth: Payer: Self-pay | Admitting: Family Medicine

## 2018-03-30 ENCOUNTER — Telehealth: Payer: Self-pay

## 2018-03-30 NOTE — Telephone Encounter (Signed)
Patient called requesting lab results. °Please follow up with patient. °

## 2018-03-30 NOTE — Telephone Encounter (Signed)
Contacted pt to go over lab results pt didn't answer lvm asking pt to give me a call at her earliest convenience  

## 2018-03-30 NOTE — Telephone Encounter (Signed)
Pt is aware of results. 

## 2018-03-31 ENCOUNTER — Ambulatory Visit (HOSPITAL_COMMUNITY)
Admission: RE | Admit: 2018-03-31 | Discharge: 2018-03-31 | Disposition: A | Discharge status: Home / Self Care | Payer: Medicaid Other | Source: Ambulatory Visit | Attending: Physician Assistant | Admitting: Physician Assistant

## 2018-03-31 ENCOUNTER — Other Ambulatory Visit: Payer: Self-pay | Admitting: Physician Assistant

## 2018-03-31 DIAGNOSIS — O200 Threatened abortion: Secondary | ICD-10-CM

## 2018-03-31 DIAGNOSIS — Z3A00 Weeks of gestation of pregnancy not specified: Secondary | ICD-10-CM | POA: Diagnosis not present

## 2018-04-01 ENCOUNTER — Inpatient Hospital Stay (HOSPITAL_COMMUNITY)
Admission: AD | Admit: 2018-04-01 | Discharge: 2018-04-01 | Disposition: A | Discharge status: Home / Self Care | Payer: Medicaid Other | Source: Ambulatory Visit | Attending: Obstetrics and Gynecology | Admitting: Obstetrics and Gynecology

## 2018-04-01 ENCOUNTER — Telehealth: Payer: Self-pay | Admitting: *Deleted

## 2018-04-01 ENCOUNTER — Inpatient Hospital Stay (HOSPITAL_COMMUNITY): Payer: Medicaid Other

## 2018-04-01 ENCOUNTER — Other Ambulatory Visit: Payer: Self-pay

## 2018-04-01 ENCOUNTER — Encounter (HOSPITAL_COMMUNITY): Payer: Self-pay | Admitting: *Deleted

## 2018-04-01 ENCOUNTER — Other Ambulatory Visit: Payer: Self-pay | Admitting: Physician Assistant

## 2018-04-01 DIAGNOSIS — Z6790 Unspecified blood type, Rh positive: Secondary | ICD-10-CM

## 2018-04-01 DIAGNOSIS — M25562 Pain in left knee: Secondary | ICD-10-CM | POA: Diagnosis not present

## 2018-04-01 DIAGNOSIS — G8929 Other chronic pain: Secondary | ICD-10-CM | POA: Insufficient documentation

## 2018-04-01 DIAGNOSIS — O039 Complete or unspecified spontaneous abortion without complication: Secondary | ICD-10-CM | POA: Diagnosis not present

## 2018-04-01 DIAGNOSIS — O4690 Antepartum hemorrhage, unspecified, unspecified trimester: Secondary | ICD-10-CM

## 2018-04-01 DIAGNOSIS — M25561 Pain in right knee: Secondary | ICD-10-CM | POA: Diagnosis not present

## 2018-04-01 DIAGNOSIS — N939 Abnormal uterine and vaginal bleeding, unspecified: Secondary | ICD-10-CM | POA: Diagnosis present

## 2018-04-01 DIAGNOSIS — O034 Incomplete spontaneous abortion without complication: Secondary | ICD-10-CM | POA: Diagnosis not present

## 2018-04-01 DIAGNOSIS — O3680X0 Pregnancy with inconclusive fetal viability, not applicable or unspecified: Secondary | ICD-10-CM

## 2018-04-01 DIAGNOSIS — O200 Threatened abortion: Secondary | ICD-10-CM

## 2018-04-01 LAB — CBC
HEMATOCRIT: 33.3 % — AB (ref 36.0–46.0)
HEMOGLOBIN: 10.8 g/dL — AB (ref 12.0–15.0)
MCH: 28.8 pg (ref 26.0–34.0)
MCHC: 32.4 g/dL (ref 30.0–36.0)
MCV: 88.8 fL (ref 78.0–100.0)
Platelets: 282 10*3/uL (ref 150–400)
RBC: 3.75 MIL/uL — AB (ref 3.87–5.11)
RDW: 13.9 % (ref 11.5–15.5)
WBC: 5.9 10*3/uL (ref 4.0–10.5)

## 2018-04-01 LAB — HCG, QUANTITATIVE, PREGNANCY: HCG, BETA CHAIN, QUANT, S: 785 m[IU]/mL — AB (ref <5)

## 2018-04-01 LAB — ABO/RH: ABO/RH(D): O POS

## 2018-04-01 MED ORDER — MISOPROSTOL 200 MCG PO TABS
800.0000 ug | ORAL_TABLET | Freq: Once | ORAL | Status: AC
  Administered 2018-04-01: 800 ug via ORAL
  Filled 2018-04-01: qty 4

## 2018-04-01 MED ORDER — IBUPROFEN 600 MG PO TABS: 600.0000 mg | ORAL_TABLET | Freq: Four times a day (QID) | ORAL | 0 refills | Status: AC | PRN

## 2018-04-01 MED ORDER — KETOROLAC TROMETHAMINE 30 MG/ML IJ SOLN
30.0000 mg | Freq: Once | INTRAMUSCULAR | Status: AC
  Administered 2018-04-01: 30 mg via INTRAMUSCULAR
  Filled 2018-04-01: qty 1

## 2018-04-01 MED ORDER — OXYCODONE-ACETAMINOPHEN 5-325 MG PO TABS
1.0000 | ORAL_TABLET | ORAL | Status: DC | PRN
  Administered 2018-04-01: 1 via ORAL
  Filled 2018-04-01: qty 1

## 2018-04-01 MED ORDER — PROMETHAZINE HCL 25 MG PO TABS: 12.5000 mg | ORAL_TABLET | Freq: Four times a day (QID) | ORAL | 0 refills | Status: DC | PRN

## 2018-04-01 MED ORDER — OXYCODONE-ACETAMINOPHEN 5-325 MG PO TABS: 1.0000 | ORAL_TABLET | ORAL | 0 refills | Status: DC | PRN

## 2018-04-01 NOTE — Telephone Encounter (Signed)
Patient verified DOB Patient is aware of D & C being a possible need due to US showing an abnormal pregnancy and the HCG level not doubling like a normal pregnancy would. Patient states she has had blood clots and passed a "sac" after the [place] was completed yesterday. Patient complains of mild cramping today. Patient states she was off work today and would report to women's as instructed.

## 2018-04-01 NOTE — Discharge Instructions (Signed)

## 2018-04-01 NOTE — MAU Provider Note (Signed)
History     CSN: [id number]  Arrival date and time: 04/01/18 1712   First Provider Initiated Contact with Patient 04/01/18 1801      Chief Complaint  Patient presents with  . Vaginal Bleeding  . Abdominal Pain   G4P2012 @[redacted]w[redacted]d  by LMP here for VB and cramping. Bleeding started about 9 days ago and has continued. Reports passing a large ?clots/tissue last night. Bleeding has been small since. Cramping started yesterday ad improved after she passed the clot/tissue. She was seen at her PCP for the bleeding and had an Korea yesterday that showed and IUGS but no YS or FP. Her quant HCG did not rise appropriately over 2 days.  OB History    Gravida  4   Para  2   Term  2   Preterm      AB  1   Living  2     SAB  1   TAB      Ectopic      Multiple      Live Births              Past Medical History:  Diagnosis Date  . Anxiety     Past Surgical History:  Procedure Laterality Date  . DILATION AND CURETTAGE OF UTERUS    . WISDOM TOOTH EXTRACTION      No family history on file.  Social History   Tobacco Use  . Smoking status: Never Smoker  . Smokeless tobacco: Never Used  Substance Use Topics  . Alcohol use: Not Currently    Alcohol/week: 1.0 standard drinks    Types: 1 Glasses of wine per week    Comment: rarely  . Drug use: No    Allergies:  Allergies  Allergen Reactions  . Doxycycline Hyclate     Dry mouth, bumps on mouth     No medications prior to admission.    Review of Systems  Constitutional: Negative for fever.  Gastrointestinal: Positive for abdominal pain.  Genitourinary: Positive for vaginal bleeding. Negative for dysuria.   Physical Exam   Blood pressure 136/82, pulse 74, temperature 97.8 F (36.6 C), temperature source Oral, resp. rate 17, height 5\' 4"  (1.626 m), weight 118.5 kg, last menstrual period 02/06/2018, SpO2 100 %.  Physical Exam  Constitutional: She is oriented to person, place, and time. She appears well-developed  and well-nourished. No distress.  HENT:  Head: Normocephalic and atraumatic.  Neck: Normal range of motion.  Respiratory: Effort normal. No respiratory distress.  GI: Soft. She exhibits no distension and no mass. There is no tenderness. There is no rebound and no guarding.  Genitourinary:  Genitourinary Comments: External: no lesions or erythema Vagina: rugated, pink, moist, small amt bloody discharge in vault Uterus: non enlarged, anteverted, non tender, no CMT Adnexae: no masses, no tenderness left, no tenderness right Cervix closed   Musculoskeletal: Normal range of motion.  Neurological: She is alert and oriented to person, place, and time.  Skin: Skin is warm and dry.  Psychiatric: She has a normal mood and affect.    Results for orders placed or performed during the hospital encounter of 04/01/18 (from the past 24 hour(s))  ABO/Rh     Status: None   Collection Time: 04/01/18  5:48 PM  Result Value Ref Range   ABO/RH(D)      O POS Performed at [facility], [address] [place], [place] [address]   CBC     Status: Abnormal   Collection Time:  04/01/18  5:48 PM  Result Value Ref Range   WBC 5.9 4.0 - 10.5 K/uL   RBC 3.75 (L) 3.87 - 5.11 MIL/uL   Hemoglobin 10.8 (L) 12.0 - 15.0 g/dL   HCT 33.3 (L) 36.0 - 46.0 %   MCV 88.8 78.0 - 100.0 fL   MCH 28.8 26.0 - 34.0 pg   MCHC 32.4 30.0 - 36.0 g/dL   RDW 13.9 11.5 - 15.5 %   Platelets 282 150 - 400 K/uL  hCG, quantitative, pregnancy     Status: Abnormal   Collection Time: 04/01/18  5:48 PM  Result Value Ref Range   hCG, Beta Chain, Quant, S 785 (H) <5 mIU/mL   US Ob Transvaginal  Result Date: 04/01/2018 CLINICAL DATA:  Pregnancy of unknown location, vaginal bleeding in first trimester of pregnancy EXAM: TRANSVAGINAL OB ULTRASOUND TECHNIQUE: Transvaginal ultrasound was performed for complete evaluation of the gestation as well as the maternal uterus, adnexal regions, and pelvic cul-de-sac. COMPARISON:  03/31/2018  FINDINGS: Intrauterine gestational sac: Not identified Yolk sac:  N/A Embryo:  N/A Cardiac Activity: N/A Heart Rate: N/A bpm MSD:   mm    w     d CRL:     mm    w  d                  [place] EDC: Subchorionic hemorrhage:  N/A Maternal uterus/adnexae: Uterus demonstrates normal morphology. Mildly heterogeneous thickening of the endometrial complex at the mid to lower uterine segment measuring up to 10 mm thick, question blood versus retained products of conception, extending into the cervix where it measures 18 x 10 mm in diameter. The large complex area of fluid and tissue seen on the previous exam is no longer identified, consistent with interval passage. No focal uterine mass. RIGHT ovary normal size and morphology 2.6 x 1.1 x 2.1 cm. LEFT ovary normal size and morphology 2.8 x 1.8 x 2.3 cm. No adnexal masses or free pelvic fluid. IMPRESSION: Interval passage of a significant portion of the abnormal material in fluid seen within the endometrial canal on the prior exam likely representing spontaneous abortion. Residual blood and/or products of conception within the endometrial canal at the mid to lower uterine segments into cervix. No intrauterine gestational sac identified; since an intrauterine gestational sac was not definitely visualized on the previous exam, recommend correlation with serial quantitative HCG and or followup ultrasound to confirm spontaneous abortion and exclude ectopic pregnancy. Electronically Signed   By: Lavonia Dana M.D.   On: 04/01/2018 19:26   MAU Course  Procedures  MDM Labs and Korea ordered and reviewed. SAB confirmed and retained POCs seen on Korea, recommend Cytotec, pt agrees. Stable for discharge home.   Assessment and Plan   1. SAB (spontaneous abortion)   2. Vaginal bleeding in pregnancy   3. Pregnancy, location unknown   4. Retained products of conception after miscarriage   5. Blood type, Rh positive   6. Chronic pain of both knees    Discharge home Follow up at [facility] in 1  week-message sent Rx Ibuprofen Rx Phenergan Rx Percocet Bleeding/return precautions Pelvic rest  Allergies as of 04/01/2018      Reactions   Doxycycline Hyclate    Dry mouth, bumps on mouth       Medication List    STOP taking these medications   cetirizine 10 MG tablet Commonly known as:  ZYRTEC   chlorhexidine 0.12 % solution Commonly known as:  PERIDEX  chlorhexidine 4 % external liquid Commonly known as:  HIBICLENS   etonogestrel-ethinyl estradiol 0.12-0.015 MG/24HR vaginal ring Commonly known as:  NUVARING   HYDROcodone-acetaminophen 5-325 MG tablet Commonly known as:  NORCO/VICODIN   mupirocin ointment 2 % Commonly known as:  BACTROBAN   omeprazole 20 MG capsule Commonly known as:  PRILOSEC   sulfamethoxazole-trimethoprim 800-160 MG tablet Commonly known as:  BACTRIM DS,SEPTRA DS   topiramate 50 MG tablet Commonly known as:  TOPAMAX     TAKE these medications   ibuprofen 600 MG tablet Commonly known as:  ADVIL,MOTRIN Take 1 tablet (600 mg total) by mouth every 6 (six) hours as needed. What changed:  when to take this   oxyCODONE-acetaminophen 5-325 MG tablet Commonly known as:  PERCOCET/ROXICET Take 1 tablet by mouth every 4 (four) hours as needed for severe pain.   promethazine 25 MG tablet Commonly known as:  PHENERGAN Take 0.5-1 tablets (12.5-25 mg total) by mouth every 6 (six) hours as needed for nausea or vomiting.      Julianne Handler, CNM 04/02/2018, 1:32 PM

## 2018-04-01 NOTE — MAU Note (Signed)
Period was 10 days late, +HPT.  2 days later, started bleeding and passing clots.  Went to primary MD, had blood work drawn, repeated level 2 days later.  Had Korea yesterday.  Was called today and told to come here ASAP. After Korea had passed a large clot, cramping subsided.   Passed 2 clots today and having a flow, light cramps.

## 2018-04-01 NOTE — Telephone Encounter (Signed)
-----   Message from Argentina Donovan, [place] sent at 04/01/2018 10:02 AM EDT ----- Please call patient- the blood test does not appear to be consistent with a normal pregnancy as the numbers did not double as they would in a regular pregnancy.  The ultrasound did not show a normal uterine pregnancy.  She may need and D&C.  I have placed an urgent referral to OB/GYN.  If she has any pain or further bleeding in the meantime, please go to [facility] hospital emergency dept. Thanks, Freeman Caldron, PA-C

## 2018-04-09 ENCOUNTER — Ambulatory Visit (INDEPENDENT_AMBULATORY_CARE_PROVIDER_SITE_OTHER): Payer: Medicaid Other | Admitting: Student

## 2018-04-09 ENCOUNTER — Encounter: Payer: Self-pay | Admitting: Student

## 2018-04-09 VITALS — BP 117/71 | HR 72 | Wt 254.2 lb

## 2018-04-09 DIAGNOSIS — O039 Complete or unspecified spontaneous abortion without complication: Secondary | ICD-10-CM

## 2018-04-09 NOTE — Progress Notes (Signed)
Patient ID: Teresa Riddle Client, female   DOB: Dec 04, 1985, 32 y.o.   MRN: [id number] History:  Ms. Teresa Riddle is a 32 y.o. [phone] who presents to clinic today for follow-up for miscarriage. Her bleeding has stopped; she was not trying to get pregnant. She took Plan B and still got pregnant.    The following portions of the patient's history were reviewed and updated as appropriate: allergies, current medications, family history, past medical history, social history, past surgical history and problem list.  Review of Systems:  Review of Systems  Constitutional: Negative.   HENT: Negative.   Respiratory: Negative.   Cardiovascular: Negative.   Gastrointestinal: Negative.   Genitourinary: Negative.   Musculoskeletal: Negative.   Skin: Negative.   Neurological: Negative.   Psychiatric/Behavioral: Negative.       Objective:  Physical Exam BP 117/71   Pulse 72   Wt 254 lb 3.2 oz (115.3 kg)   LMP 02/06/2018   BMI 43.63 kg/m  Physical Exam  Constitutional: She appears well-developed.  HENT:  Head: Normocephalic.  Neck: Normal range of motion.  Pulmonary/Chest: Effort normal.  Abdominal: Soft.  Musculoskeletal: Normal range of motion.  Neurological: She is alert.  Skin: Skin is warm.     Labs and Imaging No results found for this or any previous visit (from the past 24 hour(s)).  No results found.   Assessment & Plan:  1. Miscarriage -Doing well; period has not returned. She will start her Nuva ring once her period starts. Pap UTD (per patient).  -Knows we call her if we need her to come back for repeat BHCG.  - Beta hCG quant (ref lab)   [place], [place] 04/09/2018 3:23 PM

## 2018-04-09 NOTE — Progress Notes (Signed)
Feeling okay

## 2018-04-09 NOTE — Patient Instructions (Signed)
Ethinyl Estradiol; Etonogestrel vaginal ring What is this medicine? ETHINYL ESTRADIOL; ETONOGESTREL (ETH in il es tra DYE ole; et oh noe JES trel) vaginal ring is a flexible, vaginal ring used as a contraceptive (birth control method). This medicine combines two types of female hormones, an estrogen and a progestin. This ring is used to prevent ovulation and pregnancy. Each ring is effective for one month. This medicine may be used for other purposes; ask your health care provider or pharmacist if you have questions. COMMON BRAND NAME(S): NuvaRing What should I tell my health care provider before I take this medicine? They need to know if you have or ever had any of these conditions: -abnormal vaginal bleeding -blood vessel disease or blood clots -breast, cervical, endometrial, ovarian, liver, or uterine cancer -diabetes -gallbladder disease -heart disease or recent heart attack -high blood pressure -high cholesterol -kidney disease -liver disease -migraine headaches -stroke -systemic lupus erythematosus (SLE) -tobacco smoker -an unusual or allergic reaction to estrogens, progestins, other medicines, foods, dyes, or preservatives -pregnant or trying to get pregnant -breast-feeding How should I use this medicine? Insert the ring into your vagina as directed. Follow the directions on the prescription label. The ring will remain place for 3 weeks and is then removed for a 1-week break. A new ring is inserted 1 week after the last ring was removed, on the same day of the week. Check often to make sure the ring is still in place, especially before and after sexual intercourse. If the ring was out of the vagina for an unknown amount of time, you may not be protected from pregnancy. Perform a pregnancy test and call your doctor. Do not use more often than directed. A patient package insert for the product will be given with each prescription and refill. Read this sheet carefully each time. The  sheet may change frequently. Contact your pediatrician regarding the use of this medicine in children. Special care may be needed. This medicine has been used in female children who have started having menstrual periods. Overdosage: If you think you have taken too much of this medicine contact a poison control center or emergency room at once. NOTE: This medicine is only for you. Do not share this medicine with others. What if I miss a dose? You will need to replace your vaginal ring once a month as directed. If the ring should slip out, or if you leave it in longer or shorter than you should, contact your health care professional for advice. What may interact with this medicine? Do not take this medicine with the following medication: -dasabuvir; ombitasvir; paritaprevir; ritonavir -ombitasvir; paritaprevir; ritonavir This medicine may also interact with the following medications: -acetaminophen -antibiotics or medicines for infections, especially rifampin, rifabutin, rifapentine, and griseofulvin, and possibly penicillins or tetracyclines -aprepitant -ascorbic acid (vitamin C) -atorvastatin -barbiturate medicines, such as phenobarbital -bosentan -carbamazepine -caffeine -clofibrate -cyclosporine -dantrolene -doxercalciferol -felbamate -grapefruit juice -hydrocortisone -medicines for anxiety or sleeping problems, such as diazepam or temazepam -medicines for diabetes, including pioglitazone -modafinil -mycophenolate -nefazodone -oxcarbazepine -phenytoin -prednisolone -ritonavir or other medicines for HIV infection or AIDS -rosuvastatin -selegiline -soy isoflavones supplements -St. John's wort -tamoxifen or raloxifene -theophylline -thyroid hormones -topiramate -warfarin This list may not describe all possible interactions. Give your health care provider a list of all the medicines, herbs, non-prescription drugs, or dietary supplements you use. Also tell them if you smoke,  drink alcohol, or use illegal drugs. Some items may interact with your medicine. What should I watch for while using   this medicine? Visit your doctor or health care professional for regular checks on your progress. You will need a regular breast and pelvic exam and Pap smear while on this medicine. Use an additional method of contraception during the first cycle that you use this ring. Do not use a diaphragm or female condom, as the ring can interfere with these birth control methods and their proper placement. If you have any reason to think you are pregnant, stop using this medicine right away and contact your doctor or health care professional. If you are using this medicine for hormone related problems, it may take several cycles of use to see improvement in your condition. Smoking increases the risk of getting a blood clot or having a stroke while you are using hormonal birth control, especially if you are more than 32 years old. You are strongly advised not to smoke. This medicine can make your body retain fluid, making your fingers, hands, or ankles swell. Your blood pressure can go up. Contact your doctor or health care professional if you feel you are retaining fluid. This medicine can make you more sensitive to the sun. Keep out of the sun. If you cannot avoid being in the sun, wear protective clothing and use sunscreen. Do not use sun lamps or tanning beds/booths. If you wear contact lenses and notice visual changes, or if the lenses begin to feel uncomfortable, consult your eye care specialist. In some women, tenderness, swelling, or minor bleeding of the gums may occur. Notify your dentist if this happens. Brushing and flossing your teeth regularly may help limit this. See your dentist regularly and inform your dentist of the medicines you are taking. If you are going to have elective surgery, you may need to stop using this medicine before the surgery. Consult your health care professional  for advice. This medicine does not protect you against HIV infection (AIDS) or any other sexually transmitted diseases. What side effects may I notice from receiving this medicine? Side effects that you should report to your doctor or health care professional as soon as possible: -breast tissue changes or discharge -changes in vaginal bleeding during your period or between your periods -chest pain -coughing up blood -dizziness or fainting spells -headaches or migraines -leg, arm or groin pain -severe or sudden headaches -stomach pain (severe) -sudden shortness of breath -sudden loss of coordination, especially on one side of the body -speech problems -symptoms of vaginal infection like itching, irritation or unusual discharge -tenderness in the upper abdomen -vomiting -weakness or numbness in the arms or legs, especially on one side of the body -yellowing of the eyes or skin Side effects that usually do not require medical attention (report to your doctor or health care professional if they continue or are bothersome): -breakthrough bleeding and spotting that continues beyond the 3 initial cycles of pills -breast tenderness -mood changes, anxiety, depression, frustration, anger, or emotional outbursts -increased sensitivity to sun or ultraviolet light -nausea -skin rash, acne, or brown spots on the skin -weight gain (slight) This list may not describe all possible side effects. Call your doctor for medical advice about side effects. You may report side effects to FDA at 1-800-FDA-1088. Where should I keep my medicine? Keep out of the reach of children. Store at room temperature between 15 and 30 degrees C (59 and 86 degrees F) for up to 4 months. The product will expire after 4 months. Protect from light. Throw away any unused medicine after the expiration date. NOTE: This   sheet is a summary. It may not cover all possible information. If you have questions about this medicine, talk  to your doctor, pharmacist, or health care provider.  2018 Elsevier/Gold Standard (2016-03-29 17:00:31)  

## 2018-04-10 LAB — BETA HCG QUANT (REF LAB): hCG Quant: 39 m[IU]/mL

## 2018-04-14 ENCOUNTER — Telehealth: Payer: Self-pay

## 2018-04-14 NOTE — Telephone Encounter (Signed)
-----   Message from [place], [place] sent at 04/11/2018  8:29 AM EDT ----- Hello! This patient needs to come back for a follow-up non-stat beta hcg in one week (around 04-16-2018)

## 2018-04-14 NOTE — Telephone Encounter (Signed)
Patient will need to return to office for a non-stat beta on 04/16/2018. Called patient no answer.  Please reach out to her.Marland Kitchen

## 2018-04-15 ENCOUNTER — Telehealth: Payer: Self-pay

## 2018-04-15 NOTE — Telephone Encounter (Signed)
Per Maye Hides, CNM pt needs to have a rpt beta scheduled around 04/16/18.  Notified pt that she is needed to come in for a rpt beta.  Pt stated that she has an appt scheduled for 04/16/18.  Pt had no further questions.

## 2018-04-16 ENCOUNTER — Other Ambulatory Visit: Payer: Medicaid Other

## 2018-04-16 ENCOUNTER — Other Ambulatory Visit: Payer: Self-pay | Admitting: General Practice

## 2018-04-16 DIAGNOSIS — O039 Complete or unspecified spontaneous abortion without complication: Secondary | ICD-10-CM

## 2018-04-17 LAB — BETA HCG QUANT (REF LAB): hCG Quant: 7 m[IU]/mL

## 2018-05-20 ENCOUNTER — Other Ambulatory Visit (HOSPITAL_COMMUNITY)
Admission: RE | Admit: 2018-05-20 | Discharge: 2018-05-20 | Disposition: A | Discharge status: Home / Self Care | Payer: Medicaid Other | Source: Ambulatory Visit | Attending: Family Medicine | Admitting: Family Medicine

## 2018-05-20 ENCOUNTER — Ambulatory Visit: Payer: Medicaid Other | Attending: Family Medicine | Admitting: Physician Assistant

## 2018-05-20 ENCOUNTER — Ambulatory Visit: Payer: Self-pay

## 2018-05-20 VITALS — BP 109/71 | HR 84 | Temp 98.2°F | Ht 64.0 in | Wt 256.6 lb

## 2018-05-20 DIAGNOSIS — K219 Gastro-esophageal reflux disease without esophagitis: Secondary | ICD-10-CM

## 2018-05-20 DIAGNOSIS — F419 Anxiety disorder, unspecified: Secondary | ICD-10-CM | POA: Insufficient documentation

## 2018-05-20 DIAGNOSIS — Z79899 Other long term (current) drug therapy: Secondary | ICD-10-CM | POA: Diagnosis not present

## 2018-05-20 DIAGNOSIS — N898 Other specified noninflammatory disorders of vagina: Secondary | ICD-10-CM | POA: Diagnosis not present

## 2018-05-20 DIAGNOSIS — Z794 Long term (current) use of insulin: Secondary | ICD-10-CM | POA: Insufficient documentation

## 2018-05-20 MED ORDER — OMEPRAZOLE 20 MG PO CPDR: 20.0000 mg | DELAYED_RELEASE_CAPSULE | Freq: Every day | ORAL | 3 refills | Status: DC

## 2018-05-20 NOTE — Progress Notes (Signed)
Patient ID: [name], female   DOB: October 29, 1985, 32 y.o.   MRN: [id number]      Kayia Billinger, is a 32 y.o. female  [id number]  [id number]  DOB - [birth year]  Subjective:  Chief Complaint and HPI: Camira Geidel is a 32 y.o. female here today for GERD, increased burping, sometimes food wont go down.  No vomiting.  No diarrhea.  No constipation.    +recent Sp Ab-has been cleared with that. LMP 05/01/2018.  Also has vaginal itching every month just before cycle.  Monogamous with partner.  No pelvic pain.  No f/c.   ROS:   Constitutional:  No f/c, No night sweats, No unexplained weight loss. EENT:  No vision changes, No blurry vision, No hearing changes. No mouth, throat, or ear problems.  Respiratory: No cough, No SOB Cardiac: No CP, no palpitations GI:  No abd pain, No N/V/D.  +GERD GU: No Urinary s/sx Musculoskeletal: No joint pain Neuro: No headache, no dizziness, no motor weakness.  Skin: No rash Endocrine:  No polydipsia. No polyuria.  Psych: Denies SI/HI  No problems updated.  ALLERGIES: Allergies  Allergen Reactions  . Doxycycline Hyclate     Dry mouth, bumps on mouth     PAST MEDICAL HISTORY: Past Medical History:  Diagnosis Date  . Anxiety     MEDICATIONS AT HOME: Prior to Admission medications   Medication Sig Start Date End Date Taking? Authorizing Provider  ibuprofen (ADVIL,MOTRIN) 600 MG tablet Take 1 tablet (600 mg total) by mouth every 6 (six) hours as needed. Patient not taking: Reported on 05/20/2018 04/01/18   Julianne Handler, CNM  omeprazole (PRILOSEC) 20 MG capsule Take 1 capsule (20 mg total) by mouth daily. 05/20/18   Argentina Donovan, PA-C  oxyCODONE-acetaminophen (PERCOCET/ROXICET) 5-325 MG tablet Take 1 tablet by mouth every 4 (four) hours as needed for severe pain. Patient not taking: Reported on 04/09/2018 04/01/18   Julianne Handler, CNM  promethazine (PHENERGAN) 25 MG tablet Take 0.5-1 tablets (12.5-25 mg total) by mouth  every 6 (six) hours as needed for nausea or vomiting. Patient not taking: Reported on 04/09/2018 04/01/18   Julianne Handler, CNM     Objective:  EXAM:   Vitals:   05/20/18 1506  BP: 109/71  Pulse: 84  Temp: 98.2 F (36.8 C)  TempSrc: Oral  SpO2: 100%  Weight: 256 lb 9.6 oz (116.4 kg)  Height: 5\' 4"  (1.626 m)    General appearance : A&OX3. NAD. Non-toxic-appearing HEENT: Atraumatic and Normocephalic.  PERRLA. EOM intact.  TM clear B. Mouth-MMM, post pharynx WNL w/o erythema, No PND. Neck: supple, no JVD. No cervical lymphadenopathy. No thyromegaly Chest/Lungs:  Breathing-non-labored, Good air entry bilaterally, breath sounds normal without rales, rhonchi, or wheezing  CVS: S1 S2 regular, no murmurs, gallops, rubs  Abdomen: Bowel sounds present, Non tender and not distended with no gaurding, rigidity or rebound. Extremities: Bilateral Lower Ext shows no edema, both legs are warm to touch with = pulse throughout Neurology:  CN II-XII grossly intact, Non focal.   Psych:  TP linear. J/I WNL. Normal speech. Appropriate eye contact and affect.  Skin:  No Rash  Data Review No results found for: HGBA1C   Assessment & Plan   1. Gastroesophageal reflux disease without esophagitis Non-acute abdomen -h. pylori - omeprazole (PRILOSEC) 20 MG capsule; Take 1 capsule (20 mg total) by mouth daily.  Dispense: 30 capsule; Refill: 3 - Comprehensive metabolic panel  2. Vaginal itching - Urine cytology ancillary only  Patient have been counseled extensively about nutrition and exercise  Return in about 2 months (around 07/20/2018) for Dr Newlin-f/up GERD.  The patient was given clear instructions to go to ER or return to medical center if symptoms don't improve, worsen or new problems develop. The patient verbalized understanding. The patient was told to call to get lab results if they haven't heard anything in the next week.     Freeman Caldron, PA-C [facility]  and [place], [place]   05/20/2018, 3:26 PM

## 2018-05-20 NOTE — Patient Instructions (Signed)
Food Choices for Gastroesophageal Reflux Disease, Adult When you have gastroesophageal reflux disease (GERD), the foods you eat and your eating habits are very important. Choosing the right foods can help ease your discomfort. What guidelines do I need to follow?  Choose fruits, vegetables, whole grains, and low-fat dairy products.  Choose low-fat meat, fish, and poultry.  Limit fats such as oils, salad dressings, butter, nuts, and avocado.  Keep a food diary. This helps you identify foods that cause symptoms.  Avoid foods that cause symptoms. These may be different for everyone.  Eat small meals often instead of 3 large meals a day.  Eat your meals slowly, in a place where you are relaxed.  Limit fried foods.  Cook foods using methods other than frying.  Avoid drinking alcohol.  Avoid drinking large amounts of liquids with your meals.  Avoid bending over or lying down until 2-3 hours after eating. What foods are not recommended? These are some foods and drinks that may make your symptoms worse: Vegetables  Tomatoes. Tomato juice. Tomato and spaghetti sauce. Chili peppers. Onion and garlic. Horseradish. Fruits  Oranges, grapefruit, and lemon (fruit and juice). Meats  High-fat meats, fish, and poultry. This includes hot dogs, ribs, ham, sausage, salami, and bacon. Dairy  Whole milk and chocolate milk. Sour cream. Cream. Butter. Ice cream. Cream cheese. Drinks  Coffee and tea. Bubbly (carbonated) drinks or energy drinks. Condiments  Hot sauce. Barbecue sauce. Sweets/Desserts  Chocolate and cocoa. Donuts. Peppermint and spearmint. Fats and Oils  High-fat foods. This includes French fries and potato chips. Other  Vinegar. Strong spices. This includes black pepper, white pepper, red pepper, cayenne, curry powder, cloves, ginger, and chili powder. The items listed above may not be a complete list of foods and drinks to avoid. Contact your dietitian for more information.    This information is not intended to replace advice given to you by your health care provider. Make sure you discuss any questions you have with your health care provider. Document Released: 01/21/2012 Document Revised: 12/28/2015 Document Reviewed: 05/26/2013 Elsevier Interactive Patient Education  2017 Elsevier Inc.  

## 2018-05-21 LAB — URINE CYTOLOGY ANCILLARY ONLY
Chlamydia: NEGATIVE
Neisseria Gonorrhea: NEGATIVE
TRICH (WINDOWPATH): NEGATIVE

## 2018-05-21 LAB — COMPREHENSIVE METABOLIC PANEL
ALT: 8 IU/L (ref 0–32)
AST: 15 IU/L (ref 0–40)
Albumin/Globulin Ratio: 2 (ref 1.2–2.2)
Albumin: 4.5 g/dL (ref 3.5–5.5)
Alkaline Phosphatase: 75 IU/L (ref 39–117)
BUN/Creatinine Ratio: 19 (ref 9–23)
BUN: 15 mg/dL (ref 6–20)
Bilirubin Total: 0.3 mg/dL (ref 0.0–1.2)
CALCIUM: 9.4 mg/dL (ref 8.7–10.2)
CHLORIDE: 103 mmol/L (ref 96–106)
CO2: 26 mmol/L (ref 20–29)
Creatinine, Ser: 0.8 mg/dL (ref 0.57–1.00)
GFR, EST AFRICAN AMERICAN: 113 mL/min/{1.73_m2} (ref >59)
GFR, EST NON AFRICAN AMERICAN: 98 mL/min/{1.73_m2} (ref >59)
GLUCOSE: 61 mg/dL — AB (ref 65–99)
Globulin, Total: 2.2 g/dL (ref 1.5–4.5)
Potassium: 4 mmol/L (ref 3.5–5.2)
Sodium: 141 mmol/L (ref 134–144)
TOTAL PROTEIN: 6.7 g/dL (ref 6.0–8.5)

## 2018-05-21 LAB — H. PYLORI BREATH TEST: H pylori Breath Test: NEGATIVE

## 2018-05-22 ENCOUNTER — Telehealth: Payer: Self-pay

## 2018-05-22 NOTE — Telephone Encounter (Signed)
-----   Message from Argentina Donovan, [place] sent at 05/22/2018  6:18 AM EDT ----- Your blood work including blood sugar, kidney function, liver function, and electrolytes was normal.  Your stomach ulcer test was negative.  Follow-up as planned.  Thanks, Freeman Caldron, PA-C

## 2018-05-22 NOTE — Telephone Encounter (Signed)
Patient was called and informed  Of lab results.

## 2018-05-25 LAB — URINE CYTOLOGY ANCILLARY ONLY
Bacterial vaginitis: NEGATIVE
Candida vaginitis: POSITIVE — AB

## 2018-05-26 ENCOUNTER — Other Ambulatory Visit: Payer: Self-pay | Admitting: Physician Assistant

## 2018-05-26 ENCOUNTER — Telehealth: Payer: Self-pay | Admitting: *Deleted

## 2018-05-26 MED ORDER — FLUCONAZOLE 150 MG PO TABS: 150.0000 mg | ORAL_TABLET | Freq: Once | ORAL | 0 refills | Status: AC

## 2018-05-26 MED FILL — FLUCONAZOLE 150 MG TABS: 150 | 1 days supply | Qty: 1 | Fill #0 | Status: TO

## 2018-05-26 NOTE — Telephone Encounter (Signed)
-----   Message from Argentina Donovan, [place] sent at 05/26/2018  9:32 AM EDT ----- You tested positive for vaginal yeast.  I have sent you a prescription to our pharmacy.  Follow-up as planned.  Thanks, Freeman Caldron, PA-C

## 2018-05-26 NOTE — Telephone Encounter (Signed)
Medical Assistant left message on patient's home and cell voicemail. Voicemail states to give a call back to [place] with [facility] at [phone]. Patient is aware of script for yeast being sent to [facility] pharmacy and all other blood work being normal including the test for stomach ulcers.

## 2018-07-20 ENCOUNTER — Ambulatory Visit: Payer: Self-pay | Attending: Family Medicine | Admitting: Family Medicine

## 2018-07-20 ENCOUNTER — Encounter: Payer: Self-pay | Admitting: Family Medicine

## 2018-07-20 VITALS — BP 113/80 | HR 71 | Temp 98.0°F | Ht 64.0 in | Wt 251.6 lb

## 2018-07-20 DIAGNOSIS — Z881 Allergy status to other antibiotic agents status: Secondary | ICD-10-CM | POA: Insufficient documentation

## 2018-07-20 DIAGNOSIS — E669 Obesity, unspecified: Secondary | ICD-10-CM | POA: Insufficient documentation

## 2018-07-20 DIAGNOSIS — Z6841 Body Mass Index (BMI) 40.0 and over, adult: Secondary | ICD-10-CM | POA: Insufficient documentation

## 2018-07-20 DIAGNOSIS — L723 Sebaceous cyst: Secondary | ICD-10-CM | POA: Insufficient documentation

## 2018-07-20 DIAGNOSIS — S7011XA Contusion of right thigh, initial encounter: Secondary | ICD-10-CM | POA: Insufficient documentation

## 2018-07-20 DIAGNOSIS — Z79899 Other long term (current) drug therapy: Secondary | ICD-10-CM | POA: Insufficient documentation

## 2018-07-20 DIAGNOSIS — K219 Gastro-esophageal reflux disease without esophagitis: Secondary | ICD-10-CM | POA: Insufficient documentation

## 2018-07-20 MED ORDER — OMEPRAZOLE 20 MG PO CPDR: 20.0000 mg | DELAYED_RELEASE_CAPSULE | Freq: Every day | ORAL | 3 refills | Status: DC

## 2018-07-20 NOTE — Progress Notes (Signed)
Subjective:  Patient ID: Teresa Riddle, female    DOB: [birth year]  Age: 32 y.o. MRN: [id number]  CC: Gastroesophageal Reflux   HPI Teresa Riddle is a 32 year old female with a history of GERD,obesity who presents for a follow up visit. She suffered a miscarriage a couple of months ago and is glad she did as she is no longer with her boyfriend and feels less stressed with less anxiety. Her right knee which previously hurts no longer does ever since she stopped exercising. Reflux symptoms are controlled on her PPI. She has a hyperpigmentation on her right thigh and is unable to recall preceding trauma. She also has a knot on her right forearm which is chronic and not tender; she notices it when she bumps it against another object.  Past Medical History:  Diagnosis Date  . Anxiety     Past Surgical History:  Procedure Laterality Date  . DILATION AND CURETTAGE OF UTERUS    . WISDOM TOOTH EXTRACTION      Allergies  Allergen Reactions  . Doxycycline Hyclate     Dry mouth, bumps on mouth      Outpatient Medications Prior to Visit  Medication Sig Dispense Refill  . ibuprofen (ADVIL,MOTRIN) 600 MG tablet Take 1 tablet (600 mg total) by mouth every 6 (six) hours as needed. 30 tablet 0  . omeprazole (PRILOSEC) 20 MG capsule Take 1 capsule (20 mg total) by mouth daily. 30 capsule 3  . oxyCODONE-acetaminophen (PERCOCET/ROXICET) 5-325 MG tablet Take 1 tablet by mouth every 4 (four) hours as needed for severe pain. (Patient not taking: Reported on 04/09/2018) 6 tablet 0  . promethazine (PHENERGAN) 25 MG tablet Take 0.5-1 tablets (12.5-25 mg total) by mouth every 6 (six) hours as needed for nausea or vomiting. (Patient not taking: Reported on 04/09/2018) 30 tablet 0   No facility-administered medications prior to visit.     ROS Review of Systems  Constitutional: Negative for activity change, appetite change and fatigue.  HENT: Negative for congestion, sinus pressure and sore throat.    Eyes: Negative for visual disturbance.  Respiratory: Negative for cough, chest tightness, shortness of breath and wheezing.   Cardiovascular: Negative for chest pain and palpitations.  Gastrointestinal: Negative for abdominal distention, abdominal pain and constipation.  Endocrine: Negative for polydipsia.  Genitourinary: Negative for dysuria and frequency.  Musculoskeletal: Negative for arthralgias and back pain.  Skin: Positive for rash.  Neurological: Negative for tremors, light-headedness and numbness.  Hematological: Does not bruise/bleed easily.  Psychiatric/Behavioral: Negative for agitation and behavioral problems.    Objective:  BP 113/80   Pulse 71   Temp 98 F (36.7 C) (Oral)   Ht 5\' 4"  (1.626 m)   Wt 251 lb 9.6 oz (114.1 kg)   LMP 02/06/2018   SpO2 100%   BMI 43.19 kg/m   BP/Weight 07/20/2018 19/50/9326 02/02/2457  Systolic BP 099 833 825  Diastolic BP 80 71 71  Wt. (Lbs) 251.6 256.6 254.2  BMI 43.19 44.05 43.63      Physical Exam Constitutional:      Appearance: She is well-developed.  Cardiovascular:     Rate and Rhythm: Normal rate.     Heart sounds: Normal heart sounds. No murmur.  Pulmonary:     Effort: Pulmonary effort is normal.     Breath sounds: Normal breath sounds. No wheezing or rales.  Chest:     Chest wall: No tenderness.  Abdominal:     General: Bowel sounds are normal.  There is no distension.     Palpations: Abdomen is soft. There is no mass.     Tenderness: There is no abdominal tenderness.  Musculoskeletal: Normal range of motion.  Skin:    Comments: Bruise on anterior R thigh  Dorsal aspect of R forearm  With tiny nodule, no discharge,not TTP  Neurological:     Mental Status: She is alert and oriented to person, place, and time.      Assessment & Plan:   1. Gastroesophageal reflux disease without esophagitis Controlled - omeprazole (PRILOSEC) 20 MG capsule; Take 1 capsule (20 mg total) by mouth daily.  Dispense: 30  capsule; Refill: 3  2. Sebaceous cyst Reassurance provided  3. Contusion of right thigh, initial encounter Reassurance provided   Meds ordered this encounter  Medications  . omeprazole (PRILOSEC) 20 MG capsule    Sig: Take 1 capsule (20 mg total) by mouth daily.    Dispense:  30 capsule    Refill:  3    Follow-up: Return in about 6 months (around 01/19/2019) for follow up of chronic medical conditions.   Charlott Rakes MD

## 2018-07-20 NOTE — Progress Notes (Signed)
Patient has bump on right arm, and bruise on left thigh.

## 2018-10-14 ENCOUNTER — Ambulatory Visit: Payer: Self-pay | Attending: Family Medicine | Admitting: Physician Assistant

## 2018-10-14 ENCOUNTER — Other Ambulatory Visit: Payer: Self-pay

## 2018-10-14 VITALS — BP 111/77 | HR 75 | Temp 98.3°F | Resp 16 | Wt 253.2 lb

## 2018-10-14 DIAGNOSIS — J069 Acute upper respiratory infection, unspecified: Secondary | ICD-10-CM

## 2018-10-14 MED ORDER — BENZONATATE 100 MG PO CAPS: 200.0000 mg | ORAL_CAPSULE | Freq: Three times a day (TID) | ORAL | 0 refills | Status: DC | PRN

## 2018-10-14 MED FILL — BENZONATATE 100 MG CAP: 100 | 6 days supply | Qty: 40 | Fill #0

## 2018-10-14 NOTE — Progress Notes (Signed)
Teresa Riddle, is a 33 y.o. female  ZCH:[id number]  OIN:[id number]  DOB - 05-01-1986  Subjective:  Chief Complaint and HPI: Teresa Riddle is a 33 y.o. female here today  with a cough that started last night.  Some sinus congestion.  No fever.  No recent travel.  No exposure to flu or other illness that she knows of.  Cough is non-productive.     ROS:   Constitutional:  No f/c, No night sweats, No unexplained weight loss. EENT:  No vision changes, No blurry vision, No hearing changes. + sinus congestion Respiratory: + cough, No SOB Cardiac: No CP, no palpitations GI:  No abd pain, No N/V/D. GU: No Urinary s/sx Musculoskeletal: No joint pain Neuro: No headache, no dizziness, no motor weakness.  Skin: No rash Endocrine:  No polydipsia. No polyuria.  Psych: Denies SI/HI  No problems updated.  ALLERGIES: Allergies  Allergen Reactions  . Doxycycline Hyclate     Dry mouth, bumps on mouth     PAST MEDICAL HISTORY: Past Medical History:  Diagnosis Date  . Anxiety     MEDICATIONS AT HOME: Prior to Admission medications   Medication Sig Start Date End Date Taking? Authorizing Provider  benzonatate (TESSALON) 100 MG capsule Take 2 capsules (200 mg total) by mouth 3 (three) times daily as needed for cough. 10/14/18   Argentina Donovan, PA-C  ibuprofen (ADVIL,MOTRIN) 600 MG tablet Take 1 tablet (600 mg total) by mouth every 6 (six) hours as needed. 04/01/18   Julianne Handler, CNM  omeprazole (PRILOSEC) 20 MG capsule Take 1 capsule (20 mg total) by mouth daily. 07/20/18   Charlott Rakes, MD  oxyCODONE-acetaminophen (PERCOCET/ROXICET) 5-325 MG tablet Take 1 tablet by mouth every 4 (four) hours as needed for severe pain. Patient not taking: Reported on 04/09/2018 04/01/18   Julianne Handler, CNM  promethazine (PHENERGAN) 25 MG tablet Take 0.5-1 tablets (12.5-25 mg total) by mouth every 6 (six) hours as needed for nausea or vomiting. Patient not taking: Reported on 04/09/2018 04/01/18    Julianne Handler, CNM     Objective:  EXAM:   Vitals:   10/14/18 1129  BP: 111/77  Pulse: 75  Resp: 16  Temp: 98.3 F (36.8 C)  TempSrc: Oral  SpO2: 100%  Weight: 253 lb 3.2 oz (114.9 kg)    General appearance : A&OX3. NAD. Non-toxic-appearing HEENT: Atraumatic and Normocephalic.  PERRLA. EOM intact.  TM full/congested B. Mouth-MMM, post pharynx WNL w/o erythema, + PND. Neck: supple, no JVD. No cervical lymphadenopathy. No thyromegaly Chest/Lungs:  Breathing-non-labored, Good air entry bilaterally, breath sounds normal without rales, rhonchi, or wheezing  CVS: S1 S2 regular, no murmurs, gallops, rubs  Extremities: Bilateral Lower Ext shows no edema, both legs are warm to touch with = pulse throughout Neurology:  CN II-XII grossly intact, Non focal.   Psych:  TP linear. J/I WNL. Normal speech. Appropriate eye contact and affect.  Skin:  No Rash  Data Review No results found for: HGBA1C   Assessment & Plan   1. Viral upper respiratory tract infection Tessalon perles, fluids, rest, respiratory care.  mucinex D for congestion.     Patient have been counseled extensively about nutrition and exercise  Return if symptoms worsen or fail to improve.  The patient was given clear instructions to go to ER or return to medical center if symptoms don't improve, worsen or new problems develop. The patient verbalized understanding. The patient was told to call to get lab results if they haven't heard  anything in the next week.     Freeman Caldron, PA-C [facility] and [facility] [place], [place]   10/14/2018, 12:03 PM

## 2018-10-14 NOTE — Patient Instructions (Signed)
Mucinex D for congestion  Upper Respiratory Infection, Adult An upper respiratory infection (URI) affects the nose, throat, and upper air passages. URIs are caused by germs (viruses). The most common type of URI is often called "the common cold." Medicines cannot cure URIs, but you can do things at home to relieve your symptoms. URIs usually get better within 7-10 days. Follow these instructions at home: Activity  Rest as needed.  If you have a fever, stay home from work or school until your fever is gone, or until your doctor says you may return to work or school. ? You should stay home until you cannot spread the infection anymore (you are not contagious). ? Your doctor may have you wear a face mask so you have less risk of spreading the infection. Relieving symptoms  Gargle with a salt-water mixture 3-4 times a day or as needed. To make a salt-water mixture, completely dissolve -1 tsp of salt in 1 cup of warm water.  Use a cool-mist humidifier to add moisture to the air. This can help you breathe more easily. Eating and drinking   Drink enough fluid to keep your pee (urine) pale yellow.  Eat soups and other clear broths. General instructions   Take over-the-counter and prescription medicines only as told by your doctor. These include cold medicines, fever reducers, and cough suppressants.  Do not use any products that contain nicotine or tobacco. These include cigarettes and e-cigarettes. If you need help quitting, ask your doctor.  Avoid being where people are smoking (avoid secondhand smoke).  Make sure you get regular shots and get the flu shot every year.  Keep all follow-up visits as told by your doctor. This is important. How to avoid spreading infection to others   Wash your hands often with soap and water. If you do not have soap and water, use hand sanitizer.  Avoid touching your mouth, face, eyes, or nose.  Cough or sneeze into a tissue or your sleeve or  elbow. Do not cough or sneeze into your hand or into the air. Contact a doctor if:  You are getting worse, not better.  You have any of these: ? A fever. ? Chills. ? Brown or red mucus in your nose. ? Yellow or brown fluid (discharge)coming from your nose. ? Pain in your face, especially when you bend forward. ? Swollen neck glands. ? Pain with swallowing. ? White areas in the back of your throat. Get help right away if:  You have shortness of breath that gets worse.  You have very bad or constant: ? Headache. ? Ear pain. ? Pain in your forehead, behind your eyes, and over your cheekbones (sinus pain). ? Chest pain.  You have long-lasting (chronic) lung disease along with any of these: ? Wheezing. ? Long-lasting cough. ? Coughing up blood. ? A change in your usual mucus.  You have a stiff neck.  You have changes in your: ? Vision. ? Hearing. ? Thinking. ? Mood. Summary  An upper respiratory infection (URI) is caused by a germ called a virus. The most common type of URI is often called "the common cold."  URIs usually get better within 7-10 days.  Take over-the-counter and prescription medicines only as told by your doctor. This information is not intended to replace advice given to you by your health care provider. Make sure you discuss any questions you have with your health care provider. Document Released: 01/08/2008 Document Revised: 03/14/2017 Document Reviewed: 03/14/2017 Elsevier Interactive Patient  Education  2019 [employer].

## 2018-11-24 ENCOUNTER — Encounter: Payer: Self-pay | Admitting: Primary Care

## 2018-11-24 ENCOUNTER — Telehealth: Payer: Self-pay | Admitting: Family Medicine

## 2018-11-24 NOTE — Telephone Encounter (Signed)
Nurse called the patient's home phone number but received no answer and message was unable to be left due to a full voice mail box.

## 2018-11-24 NOTE — Telephone Encounter (Signed)
Patients called.  Patient identified by name and date of birth.  Patient states she has right ear pain.  Pain is constant.  Pain level is low 1-2/10 but increases to 5-6/10 when she swallows.  Only on the right side.  Pain is described as sharp.  Patient denies any other symptoms.  Patient has taken mucinex and sipped hot lemon water with no relief.  Patient was made an office visit with Juluis Mire.  Patient acknowledged understanding of advice.

## 2018-11-24 NOTE — Telephone Encounter (Signed)
Patient called for the nurse stating her right ear and throat hurt when she swallows. Would like a nurse to call her back to tell her what she needs to do.

## 2018-11-24 NOTE — Progress Notes (Signed)
Patient verified DOB Patient complains of ear pain beginning yesterday morning. Pain is felt in the right ear when swallowing and right side of throat is tender.  Patient has taken mucinex dm Patient has eaten today.  Patient denies any cold symptoms at home.

## 2018-11-25 ENCOUNTER — Ambulatory Visit: Payer: Self-pay | Attending: Primary Care | Admitting: Primary Care

## 2018-11-25 ENCOUNTER — Encounter: Payer: Self-pay | Admitting: Primary Care

## 2018-11-25 ENCOUNTER — Other Ambulatory Visit: Payer: Self-pay

## 2018-11-25 VITALS — BP 110/75 | HR 83 | Temp 99.0°F | Resp 18 | Ht 64.0 in | Wt 257.0 lb

## 2018-11-25 DIAGNOSIS — R609 Edema, unspecified: Secondary | ICD-10-CM

## 2018-11-25 DIAGNOSIS — J302 Other seasonal allergic rhinitis: Secondary | ICD-10-CM

## 2018-11-25 DIAGNOSIS — Z0000 Encounter for general adult medical examination without abnormal findings: Secondary | ICD-10-CM

## 2018-11-25 DIAGNOSIS — N920 Excessive and frequent menstruation with regular cycle: Secondary | ICD-10-CM

## 2018-11-25 DIAGNOSIS — H9201 Otalgia, right ear: Secondary | ICD-10-CM

## 2018-11-25 DIAGNOSIS — Z114 Encounter for screening for human immunodeficiency virus [HIV]: Secondary | ICD-10-CM

## 2018-11-25 DIAGNOSIS — D367 Benign neoplasm of other specified sites: Secondary | ICD-10-CM

## 2018-11-25 MED ORDER — FLUTICASONE PROPIONATE 50 MCG/ACT NA SUSP: 2.0000 | Freq: Every day | NASAL | 6 refills | Status: DC

## 2018-11-25 MED ORDER — FEXOFENADINE-PSEUDOEPHED ER 180-240 MG PO TB24: 1.0000 | ORAL_TABLET | Freq: Every day | ORAL | 3 refills | Status: DC

## 2018-11-25 NOTE — Progress Notes (Signed)
Acute Office Visit  Subjective:    Patient ID: Teresa Riddle, female    DOB: 1985-11-07, 32 y.o.   MRN: [id number]  Chief Complaint  Patient presents with  . Ear Pain   HPI: Patient is in today for right ear pain. She denies no trauma or entry in her ears. She does enjoy taking her index finger for external pruritis. She has a host of issues I will try to address a dark nodule area under her left arm which has changed to a harden area and black in color and itches. We discussed ABCD of cancer will refer to derm, bilateral LE edema, and watery eyes/itching.  Past Medical History:  Diagnosis Date  . Anxiety     Past Surgical History:  Procedure Laterality Date  . DILATION AND CURETTAGE OF UTERUS    . WISDOM TOOTH EXTRACTION      History reviewed. No pertinent family history.  Social History   Socioeconomic History  . Marital status: Single    Spouse name: Not on file  . Number of children: Not on file  . Years of education: Not on file  . Highest education level: Not on file  Occupational History  . Not on file  Social Needs  . Financial resource strain: Not on file  . Food insecurity:    Worry: Not on file    Inability: Not on file  . Transportation needs:    Medical: Not on file    Non-medical: Not on file  Tobacco Use  . Smoking status: Never Smoker  . Smokeless tobacco: Never Used  Substance and Sexual Activity  . Alcohol use: Not Currently    Alcohol/week: 1.0 standard drinks    Types: 1 Glasses of wine per week    Comment: rarely  . Drug use: No  . Sexual activity: Yes    Birth control/protection: I.U.D.  Lifestyle  . Physical activity:    Days per week: Not on file    Minutes per session: Not on file  . Stress: Not on file  Relationships  . Social connections:    Talks on phone: Not on file    Gets together: Not on file    Attends religious service: Not on file    Active member of club or organization: Not on file    Attends meetings of  clubs or organizations: Not on file    Relationship status: Not on file  . Intimate partner violence:    Fear of current or ex partner: Not on file    Emotionally abused: Not on file    Physically abused: Not on file    Forced sexual activity: Not on file  Other Topics Concern  . Not on file  Social History Narrative  . Not on file    Outpatient Medications Prior to Visit  Medication Sig Dispense Refill  . ibuprofen (ADVIL,MOTRIN) 600 MG tablet Take 1 tablet (600 mg total) by mouth every 6 (six) hours as needed. 30 tablet 0  . benzonatate (TESSALON) 100 MG capsule Take 2 capsules (200 mg total) by mouth 3 (three) times daily as needed for cough. 40 capsule 0  . omeprazole (PRILOSEC) 20 MG capsule Take 1 capsule (20 mg total) by mouth daily. 30 capsule 3  . oxyCODONE-acetaminophen (PERCOCET/ROXICET) 5-325 MG tablet Take 1 tablet by mouth every 4 (four) hours as needed for severe pain. (Patient not taking: Reported on 04/09/2018) 6 tablet 0  . promethazine (PHENERGAN) 25 MG tablet Take 0.5-1 tablets (  12.5-25 mg total) by mouth every 6 (six) hours as needed for nausea or vomiting. (Patient not taking: Reported on 04/09/2018) 30 tablet 0   No facility-administered medications prior to visit.     Allergies  Allergen Reactions  . Doxycycline Hyclate     Dry mouth, bumps on mouth     Review of Systems  Constitutional: Negative.   HENT: Positive for ear pain.   Eyes:       Watery/itching  Respiratory: Positive for cough.   Cardiovascular: Negative.   Gastrointestinal: Negative.   Genitourinary: Negative.   Musculoskeletal: Negative.   Skin: Positive for itching.  Neurological: Negative.   Endo/Heme/Allergies: Negative.   Psychiatric/Behavioral: The patient is nervous/anxious.        Objective:    Physical Exam  Constitutional: She is oriented to person, place, and time. She appears well-developed and well-nourished.  HENT:  Head: Normocephalic.  Boggy red nares  Eyes: EOM  are normal.  Neck: Normal range of motion.  Cardiovascular: Normal rate and regular rhythm.  Pulmonary/Chest: Effort normal. She has wheezes.  Abdominal: Soft. Bowel sounds are normal. She exhibits distension.  Musculoskeletal:        General: Edema present.  Neurological: She is alert and oriented to person, place, and time.  Skin: Skin is warm and dry.  Psychiatric: She has a normal mood and affect.    BP 110/75 (BP Location: Left Arm, Patient Position: Sitting, Cuff Size: Large)   Pulse 83   Temp 99 F (37.2 C) (Oral)   Resp 18   Ht _0  (1.626 m)   Wt 257 lb (116.6 kg)   LMP 10/27/2018   SpO2 100%   BMI 44.11 kg/m  Wt Readings from Last 3 Encounters:  11/25/18 257 lb (116.6 kg)  10/14/18 253 lb 3.2 oz (114.9 kg)  07/20/18 251 lb 9.6 oz (114.1 kg)    Health Maintenance Due  Topic Date Due  . HIV Screening  03/03/2001    There are no preventive care reminders to display for this patient.    Lab Results  Component Value Date   WBC 5.9 04/01/2018   HGB 10.8 (L) 04/01/2018   HCT 33.3 (L) 04/01/2018   MCV 88.8 04/01/2018   PLT 282 04/01/2018   Lab Results  Component Value Date   NA 141 05/20/2018   K 4.0 05/20/2018   CO2 26 05/20/2018   GLUCOSE 61 (L) 05/20/2018   BUN 15 05/20/2018   CREATININE 0.80 05/20/2018   BILITOT 0.3 05/20/2018   ALKPHOS 75 05/20/2018   AST 15 05/20/2018   ALT 8 05/20/2018   PROT 6.7 05/20/2018   ALBUMIN 4.5 05/20/2018   CALCIUM 9.4 05/20/2018   ANIONGAP 10 12/05/2015   Lab Results  Component Value Date   CHOL 172 08/28/2016   Lab Results  Component Value Date   HDL 44 (L) 08/28/2016   No results found for: [facility] Lab Results  Component Value Date   TRIG 94 08/28/2016   Lab Results  Component Value Date   CHOLHDL 3.9 08/28/2016   No results found for: HGBA1C    Assessment & Plan:   Problem List Items Addressed This Visit    Morbid obesity ([place])    Other Visit Diagnoses    Ear pain, right    -  Primary    Cyst, dermoid, arm, right       Menorrhagia with regular cycle       Relevant Orders   CBC   Seasonal  allergies       Encounter for screening for HIV       Relevant Orders   HIV Antibody (routine testing w rflx)   Healthcare maintenance       Relevant Orders   CMP14+EGFR   Edema, unspecified type       Relevant Orders   Compression stockings   Compression stockings    Arabelle was seen today for ear pain.  Diagnoses and all orders for this visit:  Ear pain, right External pruritis uses index finger to scratch her ear. TM pearly grey ossicles visible no bulging. Also, can be r/t seasonal allergies  Cyst, dermoid, arm, right Size of a pen head hard nodule and dark in color painful with palpation and itchy all the time. Discussed ABCD of skin cancer refer to derm  Morbid obesity ([place]) Discussed low carb diet essential to exercise  Menorrhagia with regular cycle May use ibuprofen prior to start of menses than during menses this will decrease pain and sometime duration. -     CBC  Seasonal allergies Prescribed Allegra D OTC with DL and Flonase   Encounter for screening for HIV -     HIV Antibody (routine testing w rflx)  Healthcare maintenance -     CMP14+EGFR  Edema, unspecified type Bilateral +2 LE edema no flip flops good supporting shoes - high top sneakers are good -     Compression stockings Also, discussed lower sodium in take   Other orders -     fluticasone (FLONASE) 50 MCG/ACT nasal spray; Place 2 sprays into both nostrils daily. -     fexofenadine-pseudoephedrine (ALLEGRA-D 24) 180-240 MG 24 hr tablet; Take 1 tablet by mouth daily for 30 days.     Meds ordered this encounter  Medications  . fluticasone (FLONASE) 50 MCG/ACT nasal spray    Sig: Place 2 sprays into both nostrils daily.    Dispense:  16 g    Refill:  6  . fexofenadine-pseudoephedrine (ALLEGRA-D 24) 180-240 MG 24 hr tablet    Sig: Take 1 tablet by mouth daily for 30 days.    Dispense:   30 tablet    Refill:  3     Kerin Perna, NP

## 2018-11-26 ENCOUNTER — Telehealth: Payer: Self-pay | Admitting: Family Medicine

## 2018-11-26 LAB — CMP14+EGFR
ALT: 12 IU/L (ref 0–32)
AST: 12 IU/L (ref 0–40)
Albumin/Globulin Ratio: 2.1 (ref 1.2–2.2)
Albumin: 4.5 g/dL (ref 3.8–4.8)
Alkaline Phosphatase: 80 IU/L (ref 39–117)
BUN/Creatinine Ratio: 13 (ref 9–23)
BUN: 11 mg/dL (ref 6–20)
Bilirubin Total: 0.6 mg/dL (ref 0.0–1.2)
CO2: 22 mmol/L (ref 20–29)
Calcium: 9.3 mg/dL (ref 8.7–10.2)
Chloride: 106 mmol/L (ref 96–106)
Creatinine, Ser: 0.82 mg/dL (ref 0.57–1.00)
GFR calc Af Amer: 109 mL/min/1.73
GFR calc non Af Amer: 95 mL/min/1.73
Globulin, Total: 2.1 g/dL (ref 1.5–4.5)
Glucose: 100 mg/dL — ABNORMAL HIGH (ref 65–99)
Potassium: 4.7 mmol/L (ref 3.5–5.2)
Sodium: 142 mmol/L (ref 134–144)
Total Protein: 6.6 g/dL (ref 6.0–8.5)

## 2018-11-26 LAB — CBC
Hematocrit: 37.2 % (ref 34.0–46.6)
Hemoglobin: 12.6 g/dL (ref 11.1–15.9)
MCH: 29.9 pg (ref 26.6–33.0)
MCHC: 33.9 g/dL (ref 31.5–35.7)
MCV: 88 fL (ref 79–97)
Platelets: 316 x10E3/uL (ref 150–450)
RBC: 4.22 x10E6/uL (ref 3.77–5.28)
RDW: 13 % (ref 11.7–15.4)
WBC: 4.9 x10E3/uL (ref 3.4–10.8)

## 2018-11-26 LAB — HIV ANTIBODY (ROUTINE TESTING W REFLEX): HIV Screen 4th Generation wRfx: NONREACTIVE

## 2018-11-26 MED ORDER — FLUTICASONE PROPIONATE 50 MCG/ACT NA SUSP: 2.0000 | Freq: Every day | NASAL | 6 refills | Status: AC

## 2018-11-26 MED ORDER — FEXOFENADINE-PSEUDOEPHED ER 180-240 MG PO TB24: 1.0000 | ORAL_TABLET | Freq: Every day | ORAL | 3 refills | Status: AC

## 2018-11-26 NOTE — Telephone Encounter (Signed)
Patients medication has been printed and placed at the front for pick up

## 2018-11-26 NOTE — Telephone Encounter (Signed)
Pt called in wanting to see if she could get a paper copy of her prescription from yesterdays appt please follow up

## 2018-11-26 NOTE — Telephone Encounter (Signed)
Hey [place] can you help this patient she saw Sharyn Lull on yesterday.

## 2019-01-26 NOTE — Progress Notes (Signed)
This encounter was created in error - please disregard.

## 2019-03-14 IMAGING — CR DG KNEE COMPLETE 4+V*R*
3 series · 3 of 3 positions shown · non-contrast
Comparison: None.

CLINICAL DATA: Chronic bilateral knee pain.

EXAM:
RIGHT KNEE - COMPLETE 4+ VIEW

[tunnel]
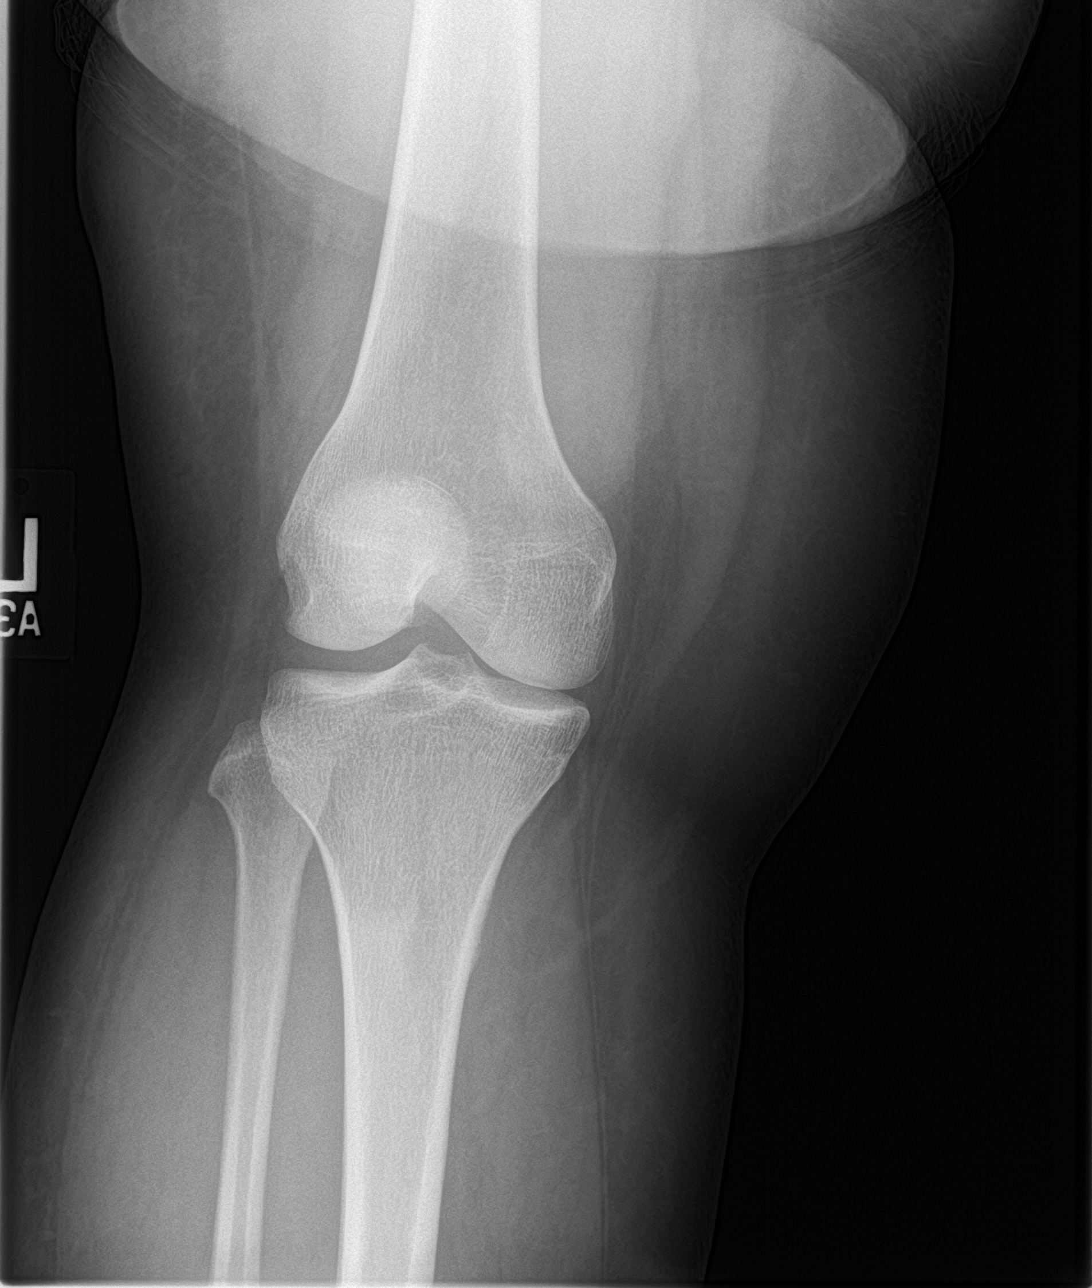

[knee lat]
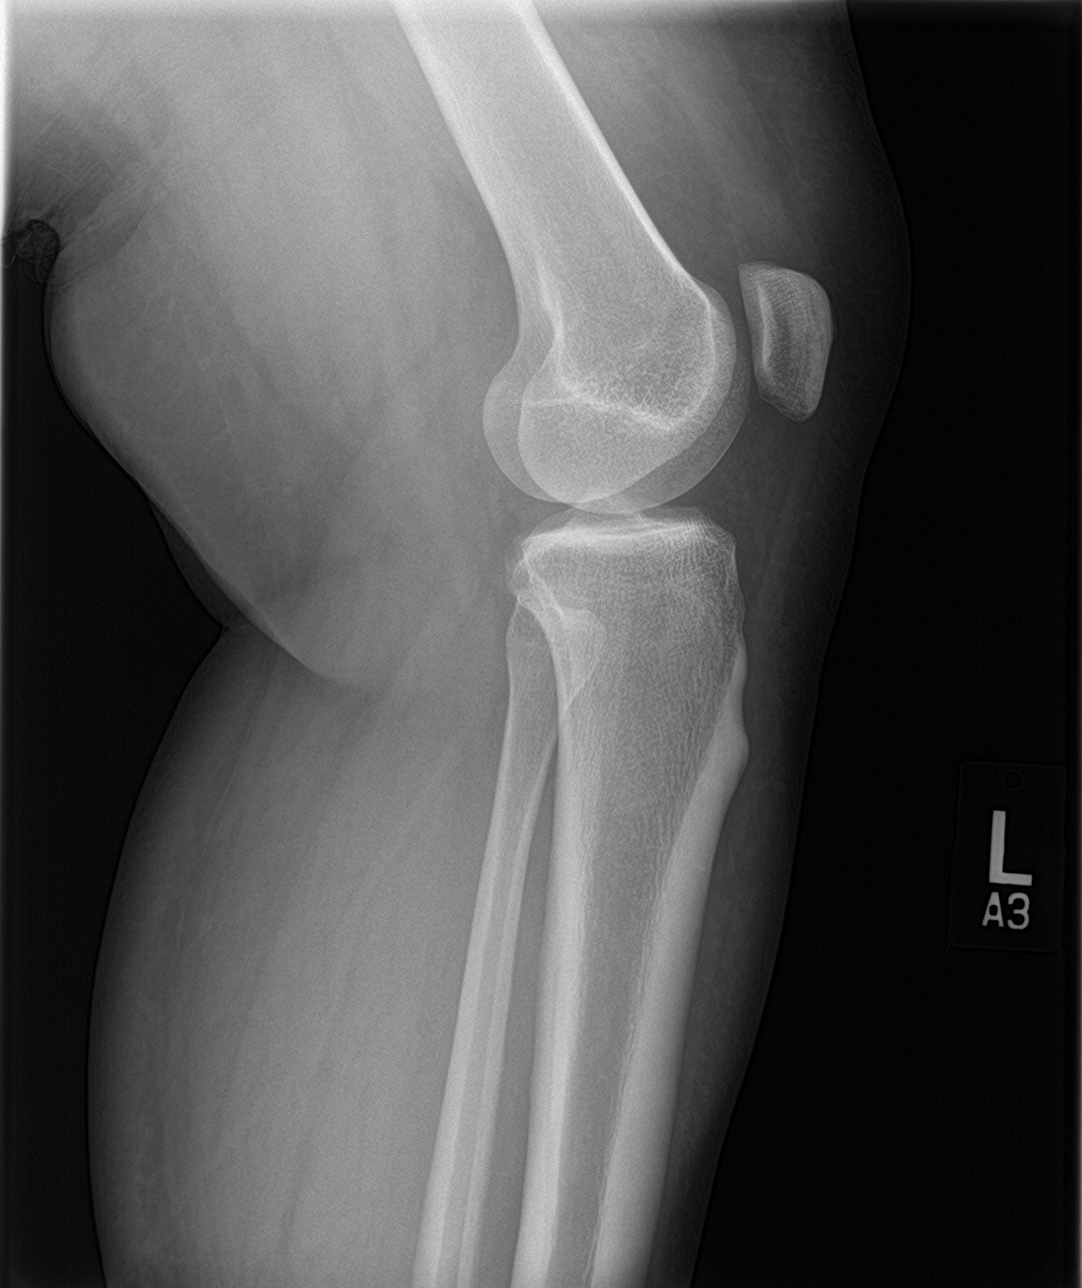

[knee sunrise]
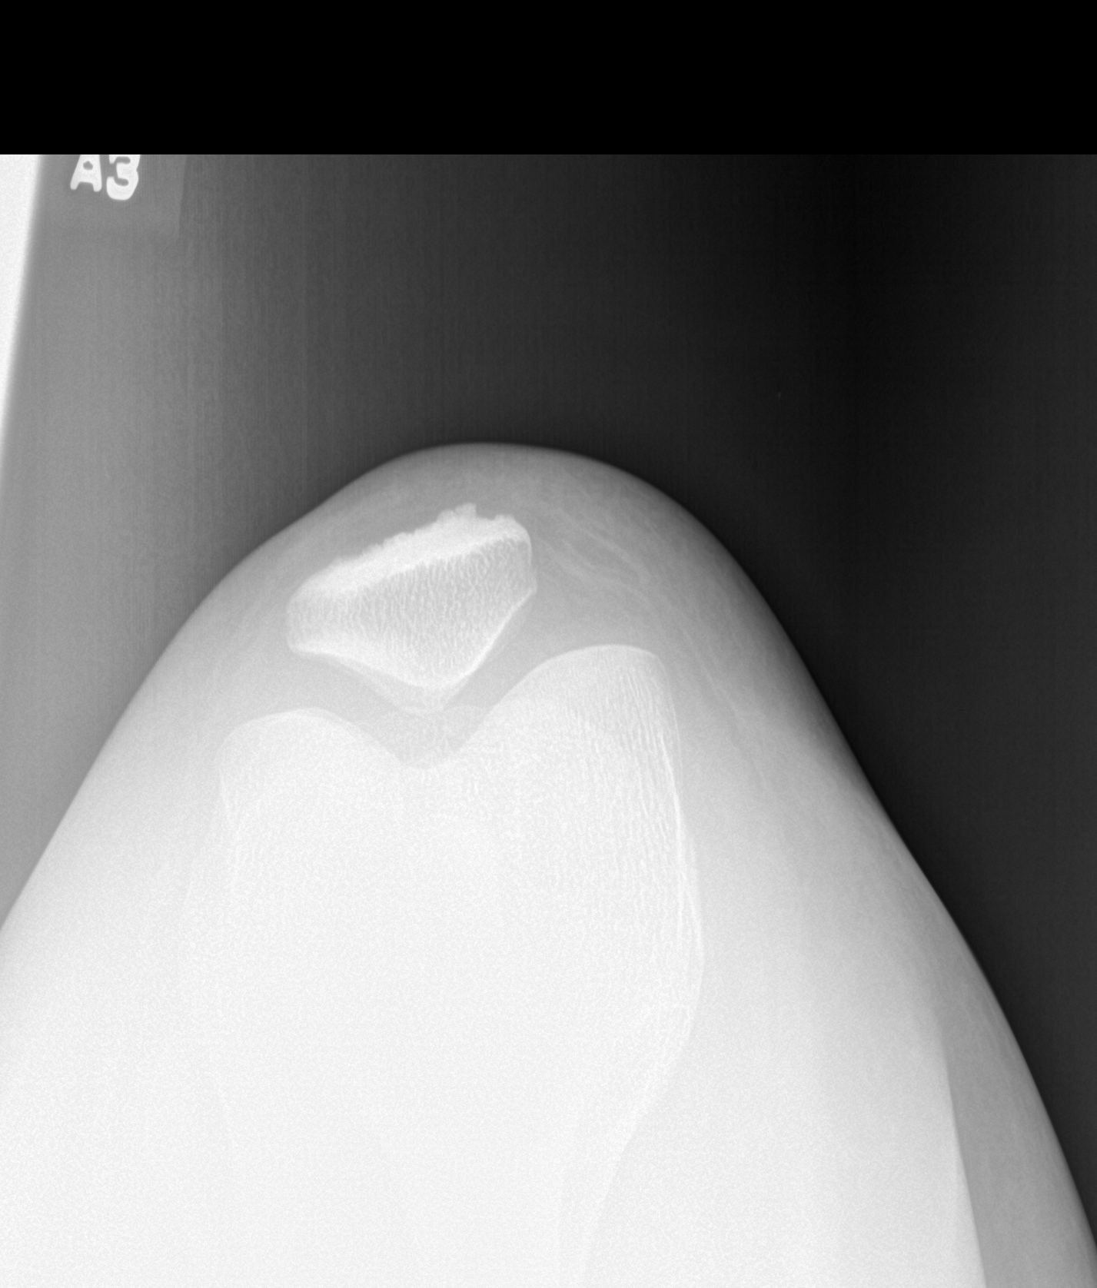

[3 of 3 positions shown; findings below may reference images not displayed]

FINDINGS: No evidence of fracture, dislocation, or joint effusion. No evidence
of arthropathy or other focal bone abnormality. Soft tissues are
unremarkable.
IMPRESSION: Normal right knee.

## 2019-03-14 IMAGING — CR DG KNEE COMPLETE 4+V*L*
4 series · 4 of 4 positions shown · non-contrast
Comparison: None.

CLINICAL DATA: Chronic bilateral knee pain.

EXAM:
LEFT KNEE - COMPLETE 4+ VIEW

[tunnel (1 of 3)]
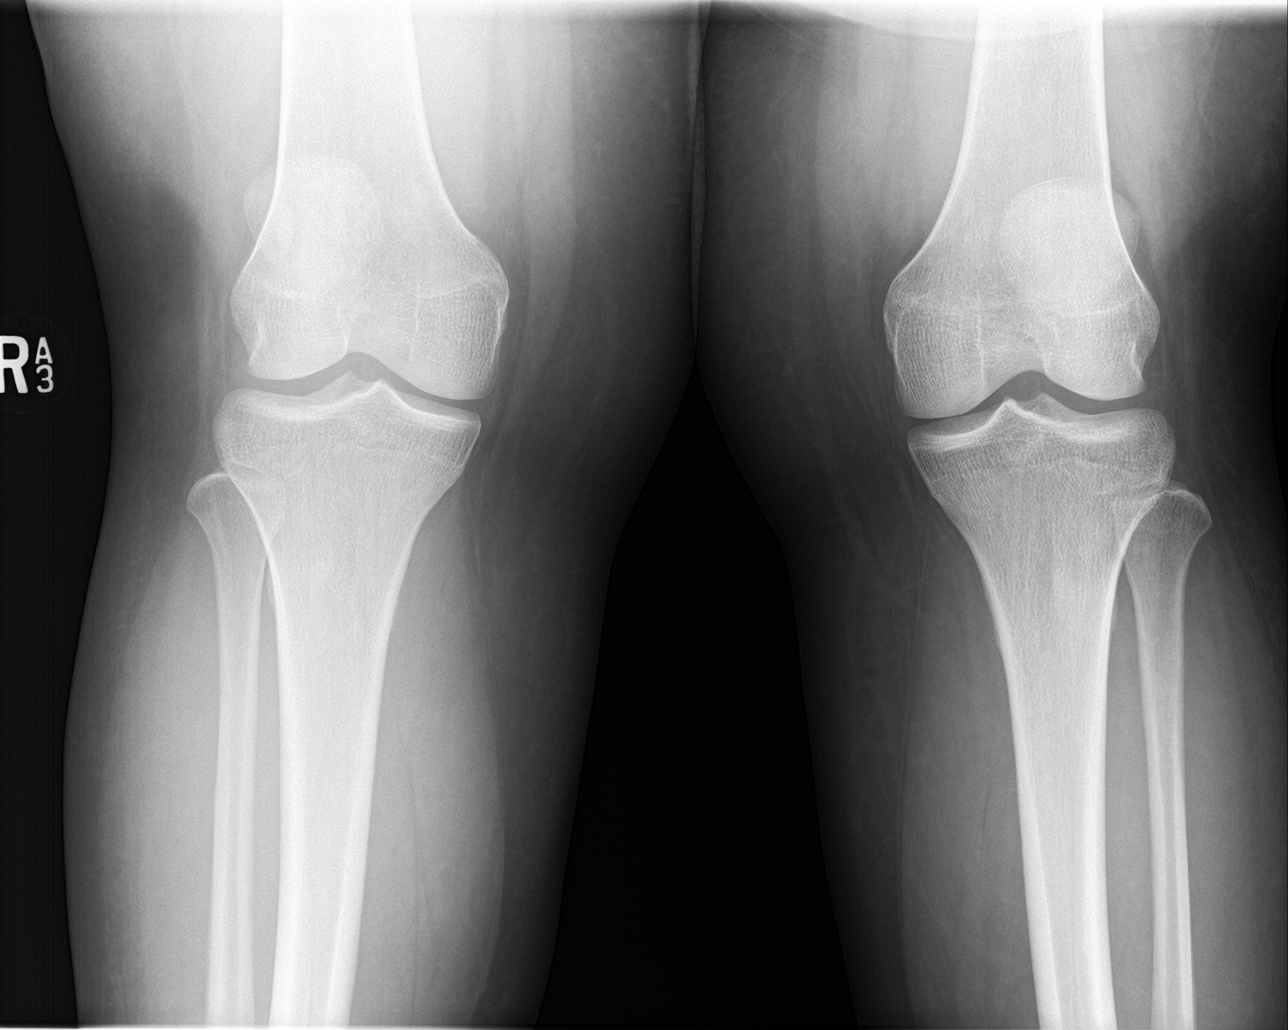

[knee sunrise]
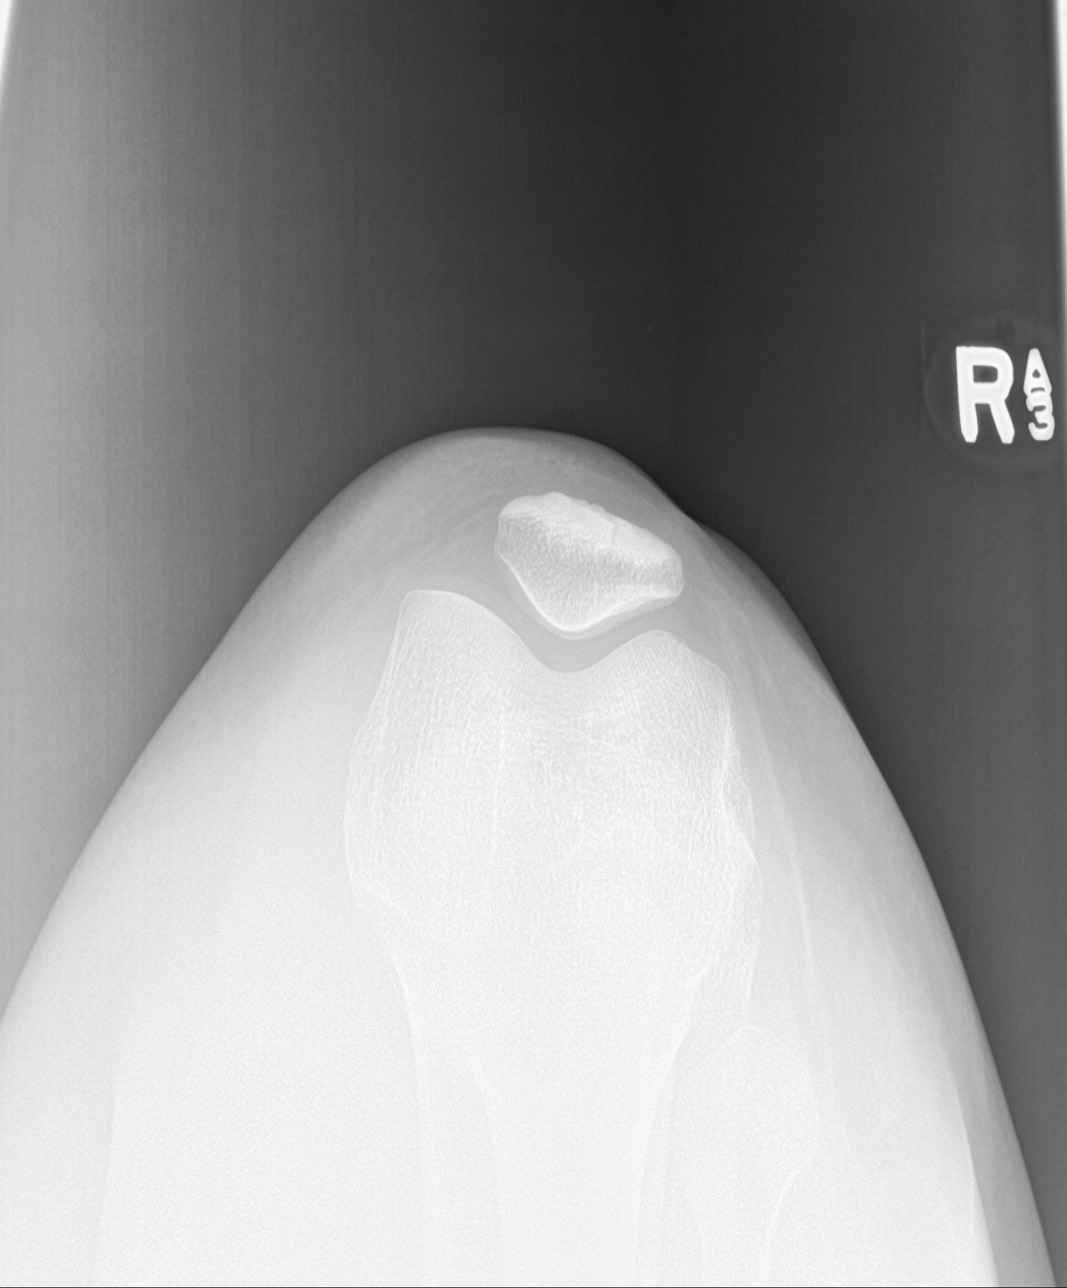

[tunnel (2 of 3)]
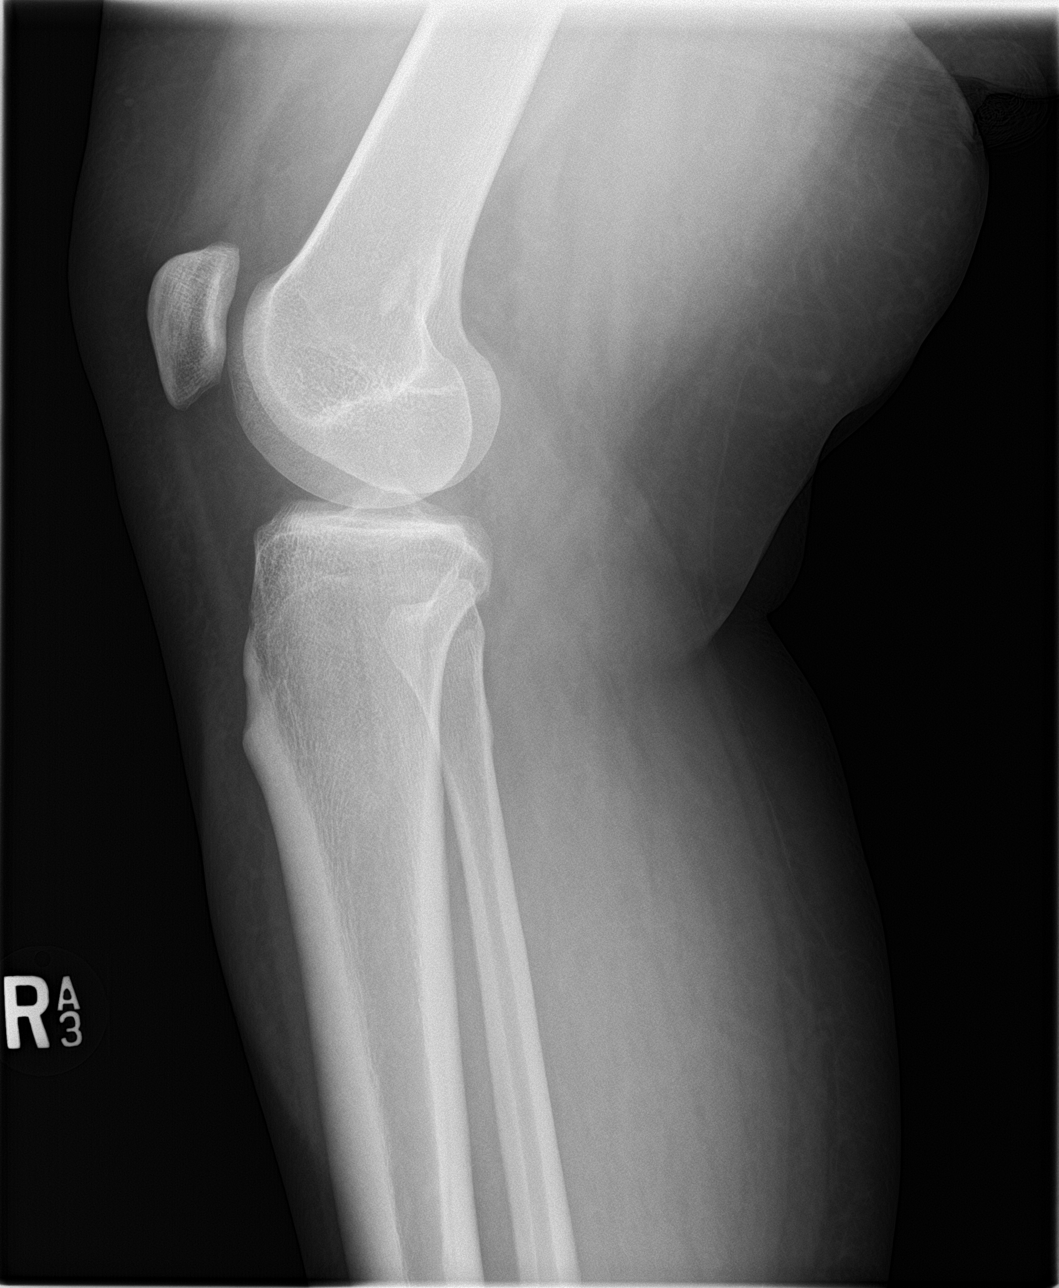

[tunnel (3 of 3)]
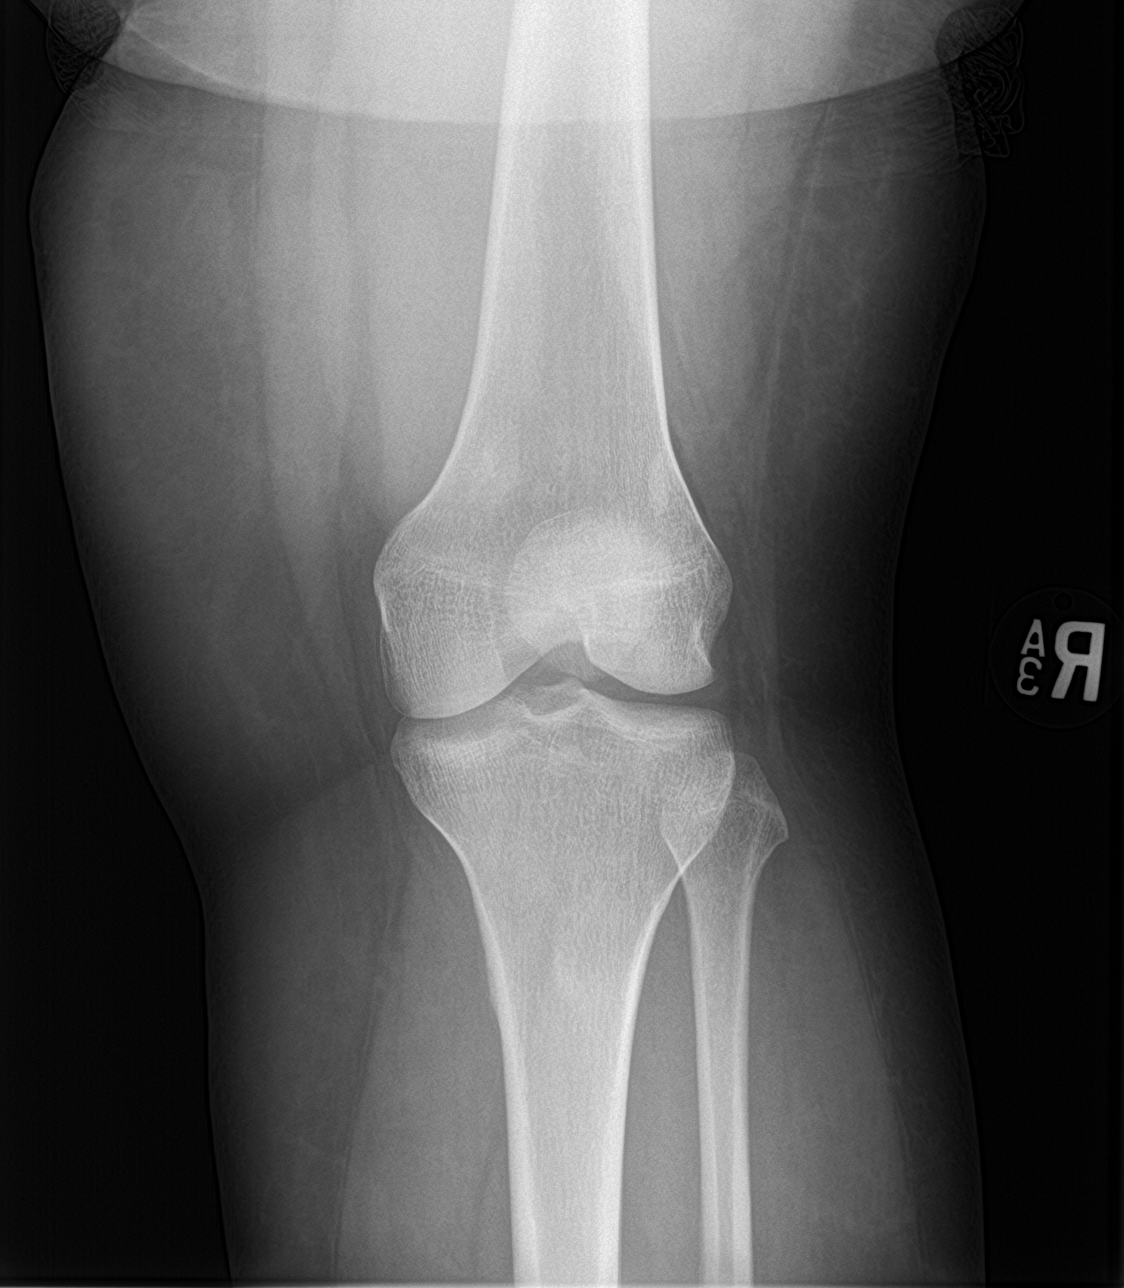

[4 of 4 positions shown; findings below may reference images not displayed]

FINDINGS: No evidence of fracture, dislocation, or joint effusion. No evidence
of arthropathy or other focal bone abnormality. Soft tissues are
unremarkable.
IMPRESSION: Normal left knee.

## 2019-06-04 ENCOUNTER — Other Ambulatory Visit: Payer: Self-pay

## 2019-06-04 DIAGNOSIS — Z20822 Contact with and (suspected) exposure to covid-19: Secondary | ICD-10-CM

## 2019-06-06 LAB — NOVEL CORONAVIRUS, NAA: SARS-CoV-2, NAA: NOT DETECTED

## 2019-10-09 ENCOUNTER — Ambulatory Visit: Payer: Medicaid Other | Attending: Internal Medicine

## 2019-10-09 DIAGNOSIS — Z23 Encounter for immunization: Secondary | ICD-10-CM | POA: Insufficient documentation

## 2019-10-09 NOTE — Progress Notes (Signed)
   Covid-19 Vaccination Clinic  Name:  Teresa Riddle    MRN: [id number] DOB: 1986-01-02  10/09/2019  [name] was observed post Covid-19 immunization for 15 minutes without incident. She was provided with Vaccine Information Sheet and instruction to access the V-Safe system.   [name] was instructed to call 911 with any severe reactions post vaccine: Marland Kitchen Difficulty breathing  . Swelling of face and throat  . A fast heartbeat  . A bad rash all over body  . Dizziness and weakness   Immunizations Administered    Name Date Dose VIS Date Route   Pfizer COVID-19 Vaccine 10/09/2019  9:40 AM 0.3 mL 07/16/2019 Intramuscular   Manufacturer: [place]   Lot: [id number]   [place]: [id number]

## 2019-10-30 ENCOUNTER — Ambulatory Visit: Payer: Medicaid Other | Attending: Internal Medicine

## 2019-10-30 DIAGNOSIS — Z23 Encounter for immunization: Secondary | ICD-10-CM

## 2019-10-30 NOTE — Progress Notes (Signed)
   Covid-19 Vaccination Clinic  Name:  Teresa Riddle    MRN: [id number] DOB: June 16, 1986  10/30/2019  [name] was observed post Covid-19 immunization for 15 minutes without incident. She was provided with Vaccine Information Sheet and instruction to access the V-Safe system.   [name] was instructed to call 911 with any severe reactions post vaccine: Marland Kitchen Difficulty breathing  . Swelling of face and throat  . A fast heartbeat  . A bad rash all over body  . Dizziness and weakness   Immunizations Administered    Name Date Dose VIS Date Route   Pfizer COVID-19 Vaccine 10/30/2019  8:20 AM 0.3 mL 07/16/2019 Intramuscular   Manufacturer: [place]   Lot: [id number]   [place]: [id number]

## 2022-05-01 ENCOUNTER — Encounter (INDEPENDENT_AMBULATORY_CARE_PROVIDER_SITE_OTHER): Payer: Self-pay | Admitting: Internal Medicine

## 2022-05-01 ENCOUNTER — Ambulatory Visit (INDEPENDENT_AMBULATORY_CARE_PROVIDER_SITE_OTHER): Payer: BC Managed Care – PPO | Admitting: Internal Medicine

## 2022-05-01 VITALS — BP 104/67 | HR 66 | Temp 98.6°F | Ht 64.0 in | Wt 243.0 lb

## 2022-05-01 DIAGNOSIS — Z0289 Encounter for other administrative examinations: Secondary | ICD-10-CM

## 2022-05-01 DIAGNOSIS — Z6841 Body Mass Index (BMI) 40.0 and over, adult: Secondary | ICD-10-CM

## 2022-05-01 DIAGNOSIS — R5383 Other fatigue: Secondary | ICD-10-CM

## 2022-05-01 NOTE — Progress Notes (Signed)
Office: [phone]  /  Fax: [phone]  Initial Visit  Teresa Riddle was seen in clinic today to evaluate for obesity. She is interested in losing weight to improve overall health and reduce the risk of weight related complications. She was referred by Dr. Mannie Stabile. Peak weight 279, has lost 36 lbs on [place]. Is not following a nutritional plan and was doing physical activity in past but not recently. She acknowledges high levels of stress at work. Sleeps 7-8 hours a day. She endorses fatigue, tiredness, joint pain and fam hx of obesity.   She presents today to review program treatment options, initial physical assessment, and evaluation.      Past medical history includes:   Past Medical History:  Diagnosis Date   Anxiety      Objective:   BP 104/67   Pulse 66   Temp 98.6 F (37 C)   Ht '5\' 4"'$  (1.626 m)   Wt 243 lb (110.2 kg)   SpO2 100%   BMI 41.71 kg/m  She was weighed on the bioimpedance scale:  Body mass index is 41.71 kg/m.  General:  Alert, oriented and cooperative. Patient is in no acute distress.  Respiratory: Normal respiratory effort, no problems with respiration noted  Extremities: Normal range of motion.    Mental Status: Normal mood and affect. Normal behavior. Normal judgment and thought content.   Assessment and Plan:  1. Class 3 severe obesity without serious comorbidity with body mass index (BMI) of 40.0 to 44.9 in adult, unspecified obesity type (HCC)  2. Other fatigue - EKG 12-Lead      Obesity Treatment Plan:  She will work on garnering support from family and friends to begin weight loss journey. Work on eliminating or reducing the presence of highly processed, calorie dense foods in the home. Complete provided nutritional and psychosocial assessment questionnaire.    Teresa Riddle will follow up in the next 1-2 weeks to review the above steps and continue evaluation and treatment.  Obesity Education Performed Today:  She was  weighed on the bioimpedance scale and results were discussed and documented in the synopsis.  We discussed obesity as a disease and the importance of a more detailed evaluation of all the factors contributing to the disease.  We discussed the importance of long term lifestyle changes which include nutrition, exercise and behavioral modifications as well as the importance of customizing this to her specific health and social needs.  We discussed the benefits of reaching a healthier weight to alleviate the symptoms of existing conditions and reduce the risks of the biomechanical, metabolic and psychological effects of obesity.  We discussed the goals of this program is to improve her overall health and not simply achieve a specific BMI.  Frequent visits are very important to patient success. I plan to see her every 2 weeks for the first 3 months and then evaluate the visit frequency after that time. I explained obesity is a life-long chronic disease and long term treatments would be required. Medications to help her follow his eating plan may be offered as appropriate but are not required. All medication decisions will be made together after the initial workup is done and benefits and side effects are discussed in depth.  The clinic rules were reviewed including the late policy, cancellation policy, no show and program fees.  Teresa Riddle appears to be in the action stage of change and states they are ready to start intensive lifestyle modifications and behavioral modifications.  30 minutes was spent today on this visit including the above counseling, pre-visit chart review, and post-visit documentation.  Thomes Dinning, MD

## 2022-08-21 ENCOUNTER — Ambulatory Visit: Payer: BC Managed Care – PPO | Admitting: Allergy

## 2022-08-21 ENCOUNTER — Encounter: Payer: Self-pay | Admitting: Allergy

## 2022-08-21 VITALS — BP 114/72 | HR 70 | Temp 98.0°F | Resp 20 | Ht 64.0 in | Wt 233.0 lb

## 2022-08-21 DIAGNOSIS — J3089 Other allergic rhinitis: Secondary | ICD-10-CM | POA: Diagnosis not present

## 2022-08-21 DIAGNOSIS — Z91018 Allergy to other foods: Secondary | ICD-10-CM

## 2022-08-21 DIAGNOSIS — H1013 Acute atopic conjunctivitis, bilateral: Secondary | ICD-10-CM

## 2022-08-21 MED ORDER — RYALTRIS 665-25 MCG/ACT NA SUSP: NASAL | 5 refills | Status: AC

## 2022-08-21 MED ORDER — OLOPATADINE HCL 0.2 % OP SOLN: 1.0000 [drp] | Freq: Every day | OPHTHALMIC | 5 refills | Status: AC | PRN

## 2022-08-21 MED ORDER — EPINEPHRINE 0.3 MG/0.3ML IJ SOAJ: 0.3000 mg | INTRAMUSCULAR | 1 refills | Status: AC | PRN

## 2022-08-21 NOTE — Progress Notes (Signed)
New Patient Note  RE: Teresa Riddle MRN: [id number] DOB: [birth year] Date of Office Visit: 08/21/2022  Primary care provider: Caren Macadam, MD  Chief Complaint: allergic reaction  History of present illness: Teresa Riddle is a 37 y.o. female presenting today for evaluation of food allergy and allergic rhinitis.  She states last year 2023 she ate some crabs and she started itching and states her lip started to feel hot like it was going to develop a bump or swell.  She also felt nauseous.  She states these symptoms started immediately.    She states she went to [employer] and had salmon and was starting to get itchy after ingestion.  She states she has no issues with fish/salmon so was concerned for cross-contamination with shellfish.   She states she had reaction to shrimp when she was younger around teenage years where she recalls having hives and does recall having itchy throat.  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  She has never had an epinephrine device for this in the past.  She states she takes zyrtec daily for at least the past 2 years and doesn't feel like it helps.  She wakes up and her eyes are puffy.  She has pressure of forehead and around the eyes, nasal congestion, throat clearing and clicking noses and itchiness.  Symptoms are year-round but worse in the spring and winter.  She states she often gets a sinus infection in the spring and winter months. She states she has taken claritin in the past.  She has used flonase that does help with congestion.  The eye drop she used in the past states was also helpful.    No history of eczema or asthma.   She does report having reflux and does avoid the foods that trigger her reflux or eat in moderation.   Review of systems: Review of Systems  Constitutional: Negative.   HENT:         See HPI  Eyes:        See HPI  Respiratory: Negative.     Cardiovascular: Negative.   Gastrointestinal: Negative.   Musculoskeletal: Negative.   Skin: Negative.   Allergic/Immunologic: Negative.   Neurological: Negative.     All other systems negative unless noted above in HPI  Past medical history: Past Medical History:  Diagnosis Date   Anxiety     Past surgical history: Past Surgical History:  Procedure Laterality Date   DILATION AND CURETTAGE OF UTERUS     WISDOM TOOTH EXTRACTION      Family history:  Family History  Problem Relation Age of Onset   Allergic rhinitis Sister    Eczema Son    Allergic rhinitis Son    Asthma Son    Allergic rhinitis Cousin    Asthma Cousin    Food Allergy Cousin    Urticaria Neg Hx    Immunodeficiency Neg Hx    Atopy Neg Hx    Angioedema Neg Hx     Social history: Lives in an apartment with carpeting with electric heating and central cooling.  No pets in the home.  No concern for water damage, mildew or roaches in the home.  She is a Environmental consultant.  Denies smoking history.    Medication List: Current Outpatient Medications  Medication Sig Dispense Refill   cetirizine (ZYRTEC) 10 MG tablet Take 10 mg by mouth daily.  EPINEPHrine 0.3 mg/0.3 mL IJ SOAJ injection Inject 0.3 mg into the muscle as needed for anaphylaxis. 1 each 1   Fluocinolone Acetonide Body 0.01 % OIL Apply topically.     fluticasone (FLONASE) 50 MCG/ACT nasal spray Place 2 sprays into both nostrils daily. 16 g 6   ibuprofen (ADVIL,MOTRIN) 600 MG tablet Take 1 tablet (600 mg total) by mouth every 6 (six) hours as needed. 30 tablet 0   levonorgestrel (KYLEENA) 19.5 MG IUD 1 each by Intrauterine route once.     Olopatadine HCl (PATADAY) 0.2 % SOLN Place 1 drop into both eyes daily as needed. 2.5 mL 5   RYALTRIS 665-25 MCG/ACT SUSP 2 sprays per nostril 1-2 times daily as needed for nasal congestion or drainage. 29 g 5   Semaglutide-Weight Management (WEGOVY) 2.4 MG/0.75ML SOAJ Inject 2.4 mg into the skin.     Vitamin D,  Ergocalciferol, (DRISDOL) 1.25 MG (50000 UNIT) CAPS capsule Take 50,000 Units by mouth every 7 (seven) days.     No current facility-administered medications for this visit.    Known medication allergies: Allergies  Allergen Reactions   Doxycycline Hyclate     Dry mouth, bumps on mouth      Physical examination: Blood pressure 114/72, pulse 70, temperature 98 F (36.7 C), temperature source Temporal, resp. rate 20, height '5\' 4"'$  (1.626 m), weight 233 lb (105.7 kg), SpO2 100 %, unknown if currently breastfeeding.  General: Alert, interactive, in no acute distress. HEENT: PERRLA, TMs pearly gray, + nasal crease, turbinates non-edematous without discharge, post-pharynx non erythematous. Neck: Supple without lymphadenopathy. Lungs: Clear to auscultation without wheezing, rhonchi or rales. {no increased work of breathing. CV: Normal S1, S2 without murmurs. Abdomen: Nondistended, nontender. Skin: Warm and dry, without lesions or rashes. Extremities:  No clubbing, cyanosis or edema. Neuro:   Grossly intact.  Diagnositics/Labs:  Allergy testing:   Airborne Adult Perc - 08/21/22 1034     Time Antigen Placed 1035    Allergen Manufacturer Lavella Hammock    Location Back    Number of Test 59    1. Control-Buffer 50% Glycerol Negative    2. Control-Histamine 1 mg/ml 2+    3. Albumin saline Negative    4. [place] 4+    5. [place] 4+    6. [place] 3+    7. [place] Blue 2+    8. Meadow Fescue 3+    9. Perennial Rye 4+    10. Sweet Vernal Negative    11. Timothy 4+    13. Burweed Marshelder 2+    14. Ragweed, short 3+    15. Ragweed, Giant 2+    16. Plantain,  English Negative    17. Lamb's Quarters Negative    18. Sheep Sorrell Negative    19. Rough Pigweed Negative    20. Marsh Elder, Rough Negative    21. Mugwort, Common 2+    22. Ash mix Negative    23. Birch mix Negative    24. Beech American Negative    25. Box, Elder Negative    26. Cedar, red Negative    27. Cottonwood,  [place] Negative    28. Elm mix Negative    29. Hickory Negative    30. Maple mix Negative    31. Oak, [place] mix Negative    32. Pecan Pollen Negative    33. Pine mix Negative    34. Sycamore Eastern Negative    35. [place], Black Pollen Negative    36. Alternaria alternata Negative  37. Cladosporium Herbarum Negative    38. Aspergillus mix Negative    39. Penicillium mix Negative    40. Bipolaris sorokiniana (Helminthosporium) Negative    41. Drechslera spicifera (Curvularia) Negative    42. Mucor plumbeus Negative    43. Fusarium moniliforme Negative    44. Aureobasidium pullulans (pullulara) Negative    45. Rhizopus oryzae Negative    46. Botrytis cinera Negative    47. Epicoccum nigrum Negative    48. Phoma betae Negative    49. Candida Albicans Negative    50. Trichophyton mentagrophytes Negative    51. Mite, D Farinae  5,000 AU/ml Negative    52. Mite, D Pteronyssinus  5,000 AU/ml 2+    53. Cat Hair 10,000 BAU/ml Negative    54.  Dog Epithelia Negative    55. Mixed Feathers Negative    56. Horse Epithelia Negative    57. Cockroach, German Negative    58. Mouse Negative    59. Tobacco Leaf Negative             Intradermal - 08/21/22 1105     Time Antigen Placed 1105    Allergen Manufacturer Other    Location Arm    Number of Test 9    Control Negative    Tree mix Negative    Mold 1 Negative    Mold 2 Negative    Mold 3 Negative    Mold 4 4+    Cat Negative    Dog Negative    Cockroach 4+             Food Adult Perc - 08/21/22 1000     Time Antigen Placed 1035    Allergen Manufacturer Lavella Hammock    Location Back    Number of allergen test 5    25. Shrimp Negative    26. Crab Negative    27. Lobster Negative    28. Oyster Negative    29. Scallops Negative             Allergy testing results were read and interpreted by provider, documented by clinical staff.   Assessment and plan: Allergic rhinitis with conjunctivitis  - Testing today  showed: grasses, ragweed, weeds, outdoor molds, dust mites, and cockroach. - Copy of test results provided.  - Avoidance measures provided. - Stop taking: Zyrtec and Flonase - Start taking: Xyzal (levocetirizine) '5mg'$  tablet once daily.  This replaces Zyrtec.  Ryaltris (olopatadine/mometasone) two sprays per nostril 1-2 times daily for nasal congestion or drainage.  Pataday (olopatadine) one drop per eye daily as needed for itchy/watery eyes.  - You can use an extra dose of the antihistamine, if needed, for breakthrough symptoms.  - Consider nasal saline rinses 1-2 times daily to remove allergens from the nasal cavities as well as help with mucous clearance (this is especially helpful to do before the nasal sprays are given) - Consider allergy shots as a means of long-term control. - Allergy shots "re-train" and "reset" the immune system to ignore environmental allergens and decrease the resulting immune response to those allergens (sneezing, itchy watery eyes, runny nose, nasal congestion, etc).    - Allergy shots improve symptoms in 80-85% of patients.   Food allergy - Continue avoidance of shellfish in diet - Skin testing for shellfish is negative thus will obtain serum IgE levels for shellfish - Have access to self-injectable epinephrine (Epipen or AuviQ) 0.'3mg'$  at all times - Follow emergency action plan in case of allergic reaction  Follow-up in 4-6 months or sooner if needed  I appreciate the opportunity to take part in Teresa Riddle's care. Please do not hesitate to contact me with questions.  Sincerely,   Prudy Feeler, MD Allergy/Immunology [facility]

## 2022-08-21 NOTE — Patient Instructions (Signed)
-  Testing today showed: grasses, ragweed, weeds, outdoor molds, dust mites, and cockroach. - Copy of test results provided.  - Avoidance measures provided. - Stop taking: Zyrtec and Flonase - Start taking: Xyzal (levocetirizine) '5mg'$  tablet once daily.  This replaces Zyrtec.  Ryaltris (olopatadine/mometasone) two sprays per nostril 1-2 times daily for nasal congestion or drainage.  Pataday (olopatadine) one drop per eye daily as needed for itchy/watery eyes.  - You can use an extra dose of the antihistamine, if needed, for breakthrough symptoms.  - Consider nasal saline rinses 1-2 times daily to remove allergens from the nasal cavities as well as help with mucous clearance (this is especially helpful to do before the nasal sprays are given) - Consider allergy shots as a means of long-term control. - Allergy shots "re-train" and "reset" the immune system to ignore environmental allergens and decrease the resulting immune response to those allergens (sneezing, itchy watery eyes, runny nose, nasal congestion, etc).    - Allergy shots improve symptoms in 80-85% of patients.   - Continue avoidance of shellfish in diet - Skin testing for shellfish is negative thus will obtain serum IgE levels for shellfish - Have access to self-injectable epinephrine (Epipen or AuviQ) 0.'3mg'$  at all times - Follow emergency action plan in case of allergic reaction   Follow-up in 4-6 months or sooner if needed

## 2022-08-23 LAB — ALLERGEN PROFILE, SHELLFISH
Clam IgE: <0.1 kU/L
F023-IgE Crab: 0.87 kU/L — AB
F080-IgE Lobster: 0.25 kU/L — AB
F290-IgE Oyster: <0.1 kU/L
Scallop IgE: <0.1 kU/L
Shrimp IgE: 1.21 kU/L — AB

## 2022-08-28 ENCOUNTER — Telehealth: Payer: Self-pay

## 2022-08-28 NOTE — Telephone Encounter (Signed)
Patient called in - DOB verified - requesting lab results.  Patient advised message would be forwarded to provider to review lab results, make recommendations then she will be contacted.  Patient verbalized understanding, no further questions.

## 2022-08-29 NOTE — Telephone Encounter (Signed)
Patient called and results reviewed.

## 2023-02-19 ENCOUNTER — Ambulatory Visit: Payer: BC Managed Care – PPO | Admitting: Allergy

## 2023-08-12 ENCOUNTER — Other Ambulatory Visit (HOSPITAL_COMMUNITY)
Admission: RE | Admit: 2023-08-12 | Discharge: 2023-08-12 | Disposition: A | Discharge status: Home / Self Care | Payer: 59 | Source: Ambulatory Visit | Attending: Obstetrics and Gynecology | Admitting: Obstetrics and Gynecology

## 2023-08-12 ENCOUNTER — Other Ambulatory Visit: Payer: Self-pay | Admitting: Obstetrics and Gynecology

## 2023-08-12 DIAGNOSIS — Z01419 Encounter for gynecological examination (general) (routine) without abnormal findings: Secondary | ICD-10-CM | POA: Diagnosis present

## 2023-08-14 LAB — CYTOLOGY - PAP
Comment: NEGATIVE
Diagnosis: NEGATIVE
High risk HPV: NEGATIVE

## 2024-02-03 ENCOUNTER — Other Ambulatory Visit: Payer: Self-pay | Admitting: Family Medicine

## 2024-02-03 DIAGNOSIS — R109 Unspecified abdominal pain: Secondary | ICD-10-CM

## 2024-02-09 ENCOUNTER — Encounter: Payer: Self-pay | Admitting: Family Medicine

## 2024-02-10 ENCOUNTER — Ambulatory Visit
Admission: RE | Admit: 2024-02-10 | Discharge: 2024-02-10 | Disposition: A | Discharge status: Home / Self Care | Source: Ambulatory Visit | Attending: Family Medicine | Admitting: Family Medicine

## 2024-02-10 DIAGNOSIS — R109 Unspecified abdominal pain: Secondary | ICD-10-CM
